# Patient Record
Sex: Female | Born: 1940
Health system: Southern US, Community
[De-identification: ages and names within clinical notes are randomized; demographics above are authoritative.]

## PROBLEM LIST (undated history)

## (undated) DIAGNOSIS — D3610 Benign neoplasm of peripheral nerves and autonomic nervous system, unspecified: Secondary | ICD-10-CM

## (undated) DIAGNOSIS — Z8619 Personal history of other infectious and parasitic diseases: Secondary | ICD-10-CM

## (undated) DIAGNOSIS — H9191 Unspecified hearing loss, right ear: Secondary | ICD-10-CM

## (undated) DIAGNOSIS — I7300 Raynaud's syndrome without gangrene: Secondary | ICD-10-CM

## (undated) DIAGNOSIS — M810 Age-related osteoporosis without current pathological fracture: Secondary | ICD-10-CM

## (undated) DIAGNOSIS — D333 Benign neoplasm of cranial nerves: Secondary | ICD-10-CM

## (undated) HISTORY — PX: ABDOMINAL HYSTERECTOMY: SHX81

## (undated) HISTORY — DX: Unspecified hearing loss, right ear: H91.91

## (undated) HISTORY — DX: Benign neoplasm of cranial nerves: D33.3

## (undated) HISTORY — DX: Age-related osteoporosis without current pathological fracture: M81.0

## (undated) HISTORY — PX: TONSILLECTOMY: SUR1361

## (undated) HISTORY — DX: Raynaud's syndrome without gangrene: I73.00

## (undated) HISTORY — DX: Personal history of other infectious and parasitic diseases: Z86.19

## (undated) HISTORY — DX: Benign neoplasm of peripheral nerves and autonomic nervous system, unspecified: D36.10

## (undated) HISTORY — PX: WISDOM TOOTH EXTRACTION: SHX21

---

## 1998-03-15 ENCOUNTER — Ambulatory Visit: Admission: RE | Admit: 1998-03-15 | Discharge: 1998-03-15 | Payer: Self-pay | Admitting: Rheumatology

## 1998-12-02 ENCOUNTER — Ambulatory Visit (HOSPITAL_BASED_OUTPATIENT_CLINIC_OR_DEPARTMENT_OTHER): Admission: RE | Admit: 1998-12-02 | Discharge: 1998-12-02 | Payer: Self-pay | Admitting: Otolaryngology

## 1999-04-22 ENCOUNTER — Ambulatory Visit (HOSPITAL_COMMUNITY): Admission: RE | Admit: 1999-04-22 | Discharge: 1999-04-22 | Payer: Self-pay | Admitting: Rheumatology

## 1999-04-22 ENCOUNTER — Encounter: Payer: Self-pay | Admitting: Rheumatology

## 1999-09-08 ENCOUNTER — Other Ambulatory Visit: Admission: RE | Admit: 1999-09-08 | Discharge: 1999-09-08 | Payer: Self-pay | Admitting: Obstetrics and Gynecology

## 2000-05-03 ENCOUNTER — Ambulatory Visit (HOSPITAL_COMMUNITY): Admission: RE | Admit: 2000-05-03 | Discharge: 2000-05-03 | Payer: Self-pay | Admitting: Rheumatology

## 2000-05-03 ENCOUNTER — Encounter: Payer: Self-pay | Admitting: Rheumatology

## 2000-11-04 ENCOUNTER — Other Ambulatory Visit: Admission: RE | Admit: 2000-11-04 | Discharge: 2000-11-04 | Payer: Self-pay | Admitting: Obstetrics and Gynecology

## 2001-04-22 ENCOUNTER — Ambulatory Visit (HOSPITAL_COMMUNITY): Admission: RE | Admit: 2001-04-22 | Discharge: 2001-04-22 | Payer: Self-pay | Admitting: *Deleted

## 2001-05-09 ENCOUNTER — Ambulatory Visit (HOSPITAL_COMMUNITY): Admission: RE | Admit: 2001-05-09 | Discharge: 2001-05-09 | Payer: Self-pay | Admitting: Rheumatology

## 2001-05-09 ENCOUNTER — Encounter: Payer: Self-pay | Admitting: Rheumatology

## 2001-07-14 ENCOUNTER — Emergency Department (HOSPITAL_COMMUNITY): Admission: EM | Admit: 2001-07-14 | Discharge: 2001-07-14 | Payer: Self-pay | Admitting: Emergency Medicine

## 2001-11-07 ENCOUNTER — Other Ambulatory Visit: Admission: RE | Admit: 2001-11-07 | Discharge: 2001-11-07 | Payer: Self-pay | Admitting: Obstetrics and Gynecology

## 2001-11-14 ENCOUNTER — Encounter: Payer: Self-pay | Admitting: Rheumatology

## 2001-11-14 ENCOUNTER — Encounter: Admission: RE | Admit: 2001-11-14 | Discharge: 2001-11-14 | Payer: Self-pay | Admitting: Rheumatology

## 2001-12-02 ENCOUNTER — Other Ambulatory Visit: Admission: RE | Admit: 2001-12-02 | Discharge: 2001-12-02 | Payer: Self-pay | Admitting: Endocrinology

## 2002-05-23 ENCOUNTER — Encounter: Payer: Self-pay | Admitting: Rheumatology

## 2002-05-23 ENCOUNTER — Ambulatory Visit (HOSPITAL_COMMUNITY): Admission: RE | Admit: 2002-05-23 | Discharge: 2002-05-23 | Payer: Self-pay | Admitting: Rheumatology

## 2003-05-08 ENCOUNTER — Ambulatory Visit (HOSPITAL_COMMUNITY): Admission: RE | Admit: 2003-05-08 | Discharge: 2003-05-08 | Payer: Self-pay | Admitting: Rheumatology

## 2003-05-12 ENCOUNTER — Emergency Department (HOSPITAL_COMMUNITY): Admission: EM | Admit: 2003-05-12 | Discharge: 2003-05-12 | Payer: Self-pay | Admitting: Emergency Medicine

## 2003-05-12 ENCOUNTER — Encounter: Payer: Self-pay | Admitting: Orthopedic Surgery

## 2003-05-30 ENCOUNTER — Ambulatory Visit (HOSPITAL_COMMUNITY): Admission: RE | Admit: 2003-05-30 | Discharge: 2003-05-30 | Payer: Self-pay | Admitting: Rheumatology

## 2003-05-30 ENCOUNTER — Encounter: Payer: Self-pay | Admitting: Rheumatology

## 2004-06-03 ENCOUNTER — Ambulatory Visit (HOSPITAL_COMMUNITY): Admission: RE | Admit: 2004-06-03 | Discharge: 2004-06-03 | Payer: Self-pay | Admitting: Rheumatology

## 2004-07-30 ENCOUNTER — Ambulatory Visit (HOSPITAL_BASED_OUTPATIENT_CLINIC_OR_DEPARTMENT_OTHER): Admission: RE | Admit: 2004-07-30 | Discharge: 2004-07-30 | Payer: Self-pay | Admitting: Surgery

## 2004-07-30 ENCOUNTER — Encounter (INDEPENDENT_AMBULATORY_CARE_PROVIDER_SITE_OTHER): Payer: Self-pay | Admitting: Specialist

## 2004-07-30 ENCOUNTER — Ambulatory Visit (HOSPITAL_COMMUNITY): Admission: RE | Admit: 2004-07-30 | Discharge: 2004-07-30 | Payer: Self-pay | Admitting: Surgery

## 2005-06-04 ENCOUNTER — Ambulatory Visit (HOSPITAL_COMMUNITY): Admission: RE | Admit: 2005-06-04 | Discharge: 2005-06-04 | Payer: Self-pay | Admitting: Rheumatology

## 2006-06-10 ENCOUNTER — Ambulatory Visit (HOSPITAL_COMMUNITY): Admission: RE | Admit: 2006-06-10 | Discharge: 2006-06-10 | Payer: Self-pay | Admitting: Rheumatology

## 2007-06-13 ENCOUNTER — Ambulatory Visit (HOSPITAL_COMMUNITY): Admission: RE | Admit: 2007-06-13 | Discharge: 2007-06-13 | Payer: Self-pay | Admitting: Rheumatology

## 2008-06-14 ENCOUNTER — Ambulatory Visit (HOSPITAL_COMMUNITY): Admission: RE | Admit: 2008-06-14 | Discharge: 2008-06-14 | Payer: Self-pay | Admitting: Rheumatology

## 2008-08-01 LAB — HM COLONOSCOPY

## 2009-08-21 ENCOUNTER — Ambulatory Visit (HOSPITAL_COMMUNITY): Admission: RE | Admit: 2009-08-21 | Discharge: 2009-08-21 | Payer: Self-pay | Admitting: Internal Medicine

## 2010-08-22 ENCOUNTER — Ambulatory Visit (HOSPITAL_COMMUNITY)
Admission: RE | Admit: 2010-08-22 | Discharge: 2010-08-22 | Payer: Self-pay | Source: Home / Self Care | Attending: Obstetrics and Gynecology | Admitting: Obstetrics and Gynecology

## 2011-01-09 NOTE — Op Note (Signed)
NAMETIARA, MAULTSBY                  ACCOUNT NO.:  [id number]   MEDICAL RECORD NO.:  [id number]          PATIENT TYPE:  AMB   LOCATION:  [facility]                         FACILITY:  [facility]   PHYSICIAN:  Currie Paris, M.D.DATE OF BIRTH:  1940/09/16   DATE OF PROCEDURE:  DATE OF DISCHARGE:                                 OPERATIVE REPORT   CCS#:  [id number].   CLINICAL COURSE:  This patient has presented with a gradually enlarging mass  in the upper left arm that has been a little sore and tender when she  touches the top of it.   DESCRIPTION OF PROCEDURE:  The patient was seen in the holding area and had  no further questions.  The area in question which was clinically thought to  be a lipoma was identified by the patient and marked by me.   She was taken to the operating room and given IV sedation. The upper arm was  prepped and draped as a sterile field. A combination of 1% Xylocaine with  epinephrine and 0.5% Marcaine were mixed equally and used for local.  The  area of the skin incision and all the overlying skin over the lipoma were  infiltrated.   I made a strict vertical incision over the mass. I  encountered the lipoma  almost directly in the subcu and using a combination of cautery scissors and  blunt dissection was able to free this up.  It was multilobulated with  little pieces of it interspersed between normal subcu tissue.  I got the  bulk of it out as a single large specimen measuring 5 cm. There appeared to  be other fragments which were then individually dissected out so that when I  was done, we had what all looked like just normal subcutaneous tissue  without any lipomatous appearing fat.  In some areas, this was right off of  the skin.   Once everything was dry, I tried to tack the subcu down to the muscle a bit  to try to reduce the chance for seroma.  The skin was then closed with 4-0  Monocryl subcuticular and Dermabond. The patient tolerated the procedure  well.  There were no operative complications. All counts were correct.     Chri   CJS/MEDQ  D:  07/30/2004  T:  07/30/2004  Job:  [id number]   cc:   Demetria Pore. Coral Spikes, M.D.  [address]  [address]  [place]  [place] [address]  Fax: [phone]

## 2011-07-27 ENCOUNTER — Other Ambulatory Visit (HOSPITAL_COMMUNITY): Payer: Self-pay | Admitting: Internal Medicine

## 2011-07-27 DIAGNOSIS — Z1231 Encounter for screening mammogram for malignant neoplasm of breast: Secondary | ICD-10-CM

## 2011-08-24 ENCOUNTER — Ambulatory Visit (HOSPITAL_COMMUNITY)
Admission: RE | Admit: 2011-08-24 | Discharge: 2011-08-24 | Disposition: A | Discharge status: Home / Self Care | Payer: Medicare Other | Source: Ambulatory Visit | Attending: Internal Medicine | Admitting: Internal Medicine

## 2011-08-24 DIAGNOSIS — Z1231 Encounter for screening mammogram for malignant neoplasm of breast: Secondary | ICD-10-CM

## 2012-07-28 ENCOUNTER — Other Ambulatory Visit (HOSPITAL_COMMUNITY): Payer: Self-pay | Admitting: Internal Medicine

## 2012-07-28 DIAGNOSIS — Z1231 Encounter for screening mammogram for malignant neoplasm of breast: Secondary | ICD-10-CM

## 2012-08-25 ENCOUNTER — Ambulatory Visit (HOSPITAL_COMMUNITY): Payer: Medicare Other

## 2012-08-29 ENCOUNTER — Ambulatory Visit (HOSPITAL_COMMUNITY): Payer: Medicare Other

## 2012-09-05 ENCOUNTER — Ambulatory Visit (HOSPITAL_COMMUNITY)
Admission: RE | Admit: 2012-09-05 | Discharge: 2012-09-05 | Disposition: A | Discharge status: Home / Self Care | Payer: Medicare Other | Source: Ambulatory Visit | Attending: Internal Medicine | Admitting: Internal Medicine

## 2012-09-05 DIAGNOSIS — Z1231 Encounter for screening mammogram for malignant neoplasm of breast: Secondary | ICD-10-CM

## 2013-10-30 ENCOUNTER — Other Ambulatory Visit (HOSPITAL_COMMUNITY): Payer: Self-pay | Admitting: Internal Medicine

## 2013-10-30 DIAGNOSIS — Z1231 Encounter for screening mammogram for malignant neoplasm of breast: Secondary | ICD-10-CM

## 2013-11-13 ENCOUNTER — Ambulatory Visit (HOSPITAL_COMMUNITY): Payer: Medicare Other

## 2013-11-14 ENCOUNTER — Ambulatory Visit (HOSPITAL_COMMUNITY)
Admission: RE | Admit: 2013-11-14 | Discharge: 2013-11-14 | Disposition: A | Discharge status: Home / Self Care | Payer: Medicare Other | Source: Ambulatory Visit | Attending: Internal Medicine | Admitting: Internal Medicine

## 2013-11-14 ENCOUNTER — Other Ambulatory Visit (HOSPITAL_COMMUNITY): Payer: Self-pay | Admitting: Internal Medicine

## 2013-11-14 DIAGNOSIS — Z1231 Encounter for screening mammogram for malignant neoplasm of breast: Secondary | ICD-10-CM

## 2016-01-28 DIAGNOSIS — Z01419 Encounter for gynecological examination (general) (routine) without abnormal findings: Secondary | ICD-10-CM | POA: Diagnosis not present

## 2016-01-28 DIAGNOSIS — Z1231 Encounter for screening mammogram for malignant neoplasm of breast: Secondary | ICD-10-CM | POA: Diagnosis not present

## 2016-01-28 DIAGNOSIS — Z1389 Encounter for screening for other disorder: Secondary | ICD-10-CM | POA: Diagnosis not present

## 2016-01-28 DIAGNOSIS — Z130 Encounter for screening for diseases of the blood and blood-forming organs and certain disorders involving the immune mechanism: Secondary | ICD-10-CM | POA: Diagnosis not present

## 2016-03-10 ENCOUNTER — Ambulatory Visit: Payer: Self-pay | Admitting: Family Medicine

## 2016-04-13 ENCOUNTER — Telehealth: Payer: Self-pay | Admitting: Behavioral Health

## 2016-04-13 NOTE — Telephone Encounter (Signed)
Per the spouse, patient is asleep at this time. Kaitlyn Hanna voiced that currently his wife is not taking any medications and does not have any known allergies. Patient is aware of tomorrow's new patient appointment with Dr. Charlett Blake at 3:00 PM.

## 2016-04-14 ENCOUNTER — Ambulatory Visit (INDEPENDENT_AMBULATORY_CARE_PROVIDER_SITE_OTHER): Payer: Medicare Other | Admitting: Family Medicine

## 2016-04-14 ENCOUNTER — Encounter: Payer: Self-pay | Admitting: Family Medicine

## 2016-04-14 VITALS — BP 108/62 | HR 88 | Temp 98.2°F | Resp 16 | Ht 60.0 in | Wt 154.6 lb

## 2016-04-14 DIAGNOSIS — Z23 Encounter for immunization: Secondary | ICD-10-CM

## 2016-04-14 DIAGNOSIS — H9191 Unspecified hearing loss, right ear: Secondary | ICD-10-CM

## 2016-04-14 DIAGNOSIS — E782 Mixed hyperlipidemia: Secondary | ICD-10-CM | POA: Diagnosis not present

## 2016-04-14 DIAGNOSIS — M8580 Other specified disorders of bone density and structure, unspecified site: Secondary | ICD-10-CM

## 2016-04-14 DIAGNOSIS — M810 Age-related osteoporosis without current pathological fracture: Secondary | ICD-10-CM | POA: Insufficient documentation

## 2016-04-14 DIAGNOSIS — I7300 Raynaud's syndrome without gangrene: Secondary | ICD-10-CM

## 2016-04-14 HISTORY — DX: Unspecified hearing loss, right ear: H91.91

## 2016-04-14 HISTORY — DX: Age-related osteoporosis without current pathological fracture: M81.0

## 2016-04-14 HISTORY — DX: Raynaud's syndrome without gangrene: I73.00

## 2016-04-14 MED ORDER — ZOSTER VACCINE LIVE 19400 UNT/0.65ML ~~LOC~~ SUSR: 0.6500 mL | Freq: Once | SUBCUTANEOUS | 0 refills | Status: AC

## 2016-04-14 NOTE — Progress Notes (Signed)
Pre visit review using our clinic review tool, if applicable. No additional management support is needed unless otherwise documented below in the visit note. 

## 2016-04-14 NOTE — Patient Instructions (Signed)
Preventive Care for Adults, Female A healthy lifestyle and preventive care can promote health and wellness. Preventive health guidelines for women include the following key practices.  A routine yearly physical is a good way to check with your health care provider about your health and preventive screening. It is a chance to share any concerns and updates on your health and to receive a thorough exam.  Visit your dentist for a routine exam and preventive care every 6 months. Brush your teeth twice a day and floss once a day. Good oral hygiene prevents tooth decay and gum disease.  The frequency of eye exams is based on your age, health, family medical history, use of contact lenses, and other factors. Follow your health care provider's recommendations for frequency of eye exams.  Eat a healthy diet. Foods like vegetables, fruits, whole grains, low-fat dairy products, and lean protein foods contain the nutrients you need without too many calories. Decrease your intake of foods high in solid fats, added sugars, and salt. Eat the right amount of calories for you.Get information about a proper diet from your health care provider, if necessary.  Regular physical exercise is one of the most important things you can do for your health. Most adults should get at least 150 minutes of moderate-intensity exercise (any activity that increases your heart rate and causes you to sweat) each week. In addition, most adults need muscle-strengthening exercises on 2 or more days a week.  Maintain a healthy weight. The body mass index (BMI) is a screening tool to identify possible weight problems. It provides an estimate of body fat based on height and weight. Your health care provider can find your BMI and can help you achieve or maintain a healthy weight.For adults 20 years and older:  A BMI below 18.5 is considered underweight.  A BMI of 18.5 to 24.9 is normal.  A BMI of 25 to 29.9 is considered overweight.  A  BMI of 30 and above is considered obese.  Maintain normal blood lipids and cholesterol levels by exercising and minimizing your intake of saturated fat. Eat a balanced diet with plenty of fruit and vegetables. Blood tests for lipids and cholesterol should begin at age 45 and be repeated every 5 years. If your lipid or cholesterol levels are high, you are over 50, or you are at high risk for heart disease, you may need your cholesterol levels checked more frequently.Ongoing high lipid and cholesterol levels should be treated with medicines if diet and exercise are not working.  If you smoke, find out from your health care provider how to quit. If you do not use tobacco, do not start.  Lung cancer screening is recommended for adults aged 45-80 years who are at high risk for developing lung cancer because of a history of smoking. A yearly low-dose CT scan of the lungs is recommended for people who have at least a 30-pack-year history of smoking and are a current smoker or have quit within the past 15 years. A pack year of smoking is smoking an average of 1 pack of cigarettes a day for 1 year (for example: 1 pack a day for 30 years or 2 packs a day for 15 years). Yearly screening should continue until the smoker has stopped smoking for at least 15 years. Yearly screening should be stopped for people who develop a health problem that would prevent them from having lung cancer treatment.  If you are pregnant, do not drink alcohol. If you are  breastfeeding, be very cautious about drinking alcohol. If you are not pregnant and choose to drink alcohol, do not have more than 1 drink per day. One drink is considered to be 12 ounces (355 mL) of beer, 5 ounces (148 mL) of wine, or 1.5 ounces (44 mL) of liquor.  Avoid use of street drugs. Do not share needles with anyone. Ask for help if you need support or instructions about stopping the use of drugs.  High blood pressure causes heart disease and increases the risk  of stroke. Your blood pressure should be checked at least every 1 to 2 years. Ongoing high blood pressure should be treated with medicines if weight loss and exercise do not work.  If you are 55-79 years old, ask your health care provider if you should take aspirin to prevent strokes.  Diabetes screening is done by taking a blood sample to check your blood glucose level after you have not eaten for a certain period of time (fasting). If you are not overweight and you do not have risk factors for diabetes, you should be screened once every 3 years starting at age 45. If you are overweight or obese and you are 40-70 years of age, you should be screened for diabetes every year as part of your cardiovascular risk assessment.  Breast cancer screening is essential preventive care for women. You should practice "breast self-awareness." This means understanding the normal appearance and feel of your breasts and may include breast self-examination. Any changes detected, no matter how small, should be reported to a health care provider. Women in their 20s and 30s should have a clinical breast exam (CBE) by a health care provider as part of a regular health exam every 1 to 3 years. After age 40, women should have a CBE every year. Starting at age 40, women should consider having a mammogram (breast X-ray test) every year. Women who have a family history of breast cancer should talk to their health care provider about genetic screening. Women at a high risk of breast cancer should talk to their health care providers about having an MRI and a mammogram every year.  Breast cancer gene (BRCA)-related cancer risk assessment is recommended for women who have family members with BRCA-related cancers. BRCA-related cancers include breast, ovarian, tubal, and peritoneal cancers. Having family members with these cancers may be associated with an increased risk for harmful changes (mutations) in the breast cancer genes BRCA1 and  BRCA2. Results of the assessment will determine the need for genetic counseling and BRCA1 and BRCA2 testing.  Your health care provider may recommend that you be screened regularly for cancer of the pelvic organs (ovaries, uterus, and vagina). This screening involves a pelvic examination, including checking for microscopic changes to the surface of your cervix (Pap test). You may be encouraged to have this screening done every 3 years, beginning at age 21.  For women ages 30-65, health care providers may recommend pelvic exams and Pap testing every 3 years, or they may recommend the Pap and pelvic exam, combined with testing for human papilloma virus (HPV), every 5 years. Some types of HPV increase your risk of cervical cancer. Testing for HPV may also be done on women of any age with unclear Pap test results.  Other health care providers may not recommend any screening for nonpregnant women who are considered low risk for pelvic cancer and who do not have symptoms. Ask your health care provider if a screening pelvic exam is right for   you.  If you have had past treatment for cervical cancer or a condition that could lead to cancer, you need Pap tests and screening for cancer for at least 20 years after your treatment. If Pap tests have been discontinued, your risk factors (such as having a new sexual partner) need to be reassessed to determine if screening should resume. Some women have medical problems that increase the chance of getting cervical cancer. In these cases, your health care provider may recommend more frequent screening and Pap tests.  Colorectal cancer can be detected and often prevented. Most routine colorectal cancer screening begins at the age of 50 years and continues through age 75 years. However, your health care provider may recommend screening at an earlier age if you have risk factors for colon cancer. On a yearly basis, your health care provider may provide home test kits to check  for hidden blood in the stool. Use of a small camera at the end of a tube, to directly examine the colon (sigmoidoscopy or colonoscopy), can detect the earliest forms of colorectal cancer. Talk to your health care provider about this at age 50, when routine screening begins. Direct exam of the colon should be repeated every 5-10 years through age 75 years, unless early forms of precancerous polyps or small growths are found.  People who are at an increased risk for hepatitis B should be screened for this virus. You are considered at high risk for hepatitis B if:  You were born in a country where hepatitis B occurs often. Talk with your health care provider about which countries are considered high risk.  Your parents were born in a high-risk country and you have not received a shot to protect against hepatitis B (hepatitis B vaccine).  You have HIV or AIDS.  You use needles to inject street drugs.  You live with, or have sex with, someone who has hepatitis B.  You get hemodialysis treatment.  You take certain medicines for conditions like cancer, organ transplantation, and autoimmune conditions.  Hepatitis C blood testing is recommended for all people born from 1945 through 1965 and any individual with known risks for hepatitis C.  Practice safe sex. Use condoms and avoid high-risk sexual practices to reduce the spread of sexually transmitted infections (STIs). STIs include gonorrhea, chlamydia, syphilis, trichomonas, herpes, HPV, and human immunodeficiency virus (HIV). Herpes, HIV, and HPV are viral illnesses that have no cure. They can result in disability, cancer, and death.  You should be screened for sexually transmitted illnesses (STIs) including gonorrhea and chlamydia if:  You are sexually active and are younger than 24 years.  You are older than 24 years and your health care provider tells you that you are at risk for this type of infection.  Your sexual activity has changed  since you were last screened and you are at an increased risk for chlamydia or gonorrhea. Ask your health care provider if you are at risk.  If you are at risk of being infected with HIV, it is recommended that you take a prescription medicine daily to prevent HIV infection. This is called preexposure prophylaxis (PrEP). You are considered at risk if:  You are sexually active and do not regularly use condoms or know the HIV status of your partner(s).  You take drugs by injection.  You are sexually active with a partner who has HIV.  Talk with your health care provider about whether you are at high risk of being infected with HIV. If   you choose to begin PrEP, you should first be tested for HIV. You should then be tested every 3 months for as long as you are taking PrEP.  Osteoporosis is a disease in which the bones lose minerals and strength with aging. This can result in serious bone fractures or breaks. The risk of osteoporosis can be identified using a bone density scan. Women ages 67 years and over and women at risk for fractures or osteoporosis should discuss screening with their health care providers. Ask your health care provider whether you should take a calcium supplement or vitamin D to reduce the rate of osteoporosis.  Menopause can be associated with physical symptoms and risks. Hormone replacement therapy is available to decrease symptoms and risks. You should talk to your health care provider about whether hormone replacement therapy is right for you.  Use sunscreen. Apply sunscreen liberally and repeatedly throughout the day. You should seek shade when your shadow is shorter than you. Protect yourself by wearing long sleeves, pants, a wide-brimmed hat, and sunglasses year round, whenever you are outdoors.  Once a month, do a whole body skin exam, using a mirror to look at the skin on your back. Tell your health care provider of new moles, moles that have irregular borders, moles that  are larger than a pencil eraser, or moles that have changed in shape or color.  Stay current with required vaccines (immunizations).  Influenza vaccine. All adults should be immunized every year.  Tetanus, diphtheria, and acellular pertussis (Td, Tdap) vaccine. Pregnant women should receive 1 dose of Tdap vaccine during each pregnancy. The dose should be obtained regardless of the length of time since the last dose. Immunization is preferred during the 27th-36th week of gestation. An adult who has not previously received Tdap or who does not know her vaccine status should receive 1 dose of Tdap. This initial dose should be followed by tetanus and diphtheria toxoids (Td) booster doses every 10 years. Adults with an unknown or incomplete history of completing a 3-dose immunization series with Td-containing vaccines should begin or complete a primary immunization series including a Tdap dose. Adults should receive a Td booster every 10 years.  Varicella vaccine. An adult without evidence of immunity to varicella should receive 2 doses or a second dose if she has previously received 1 dose. Pregnant females who do not have evidence of immunity should receive the first dose after pregnancy. This first dose should be obtained before leaving the health care facility. The second dose should be obtained 4-8 weeks after the first dose.  Human papillomavirus (HPV) vaccine. Females aged 13-26 years who have not received the vaccine previously should obtain the 3-dose series. The vaccine is not recommended for use in pregnant females. However, pregnancy testing is not needed before receiving a dose. If a female is found to be pregnant after receiving a dose, no treatment is needed. In that case, the remaining doses should be delayed until after the pregnancy. Immunization is recommended for any person with an immunocompromised condition through the age of 61 years if she did not get any or all doses earlier. During the  3-dose series, the second dose should be obtained 4-8 weeks after the first dose. The third dose should be obtained 24 weeks after the first dose and 16 weeks after the second dose.  Zoster vaccine. One dose is recommended for adults aged 30 years or older unless certain conditions are present.  Measles, mumps, and rubella (MMR) vaccine. Adults born  before 1957 generally are considered immune to measles and mumps. Adults born in 1957 or later should have 1 or more doses of MMR vaccine unless there is a contraindication to the vaccine or there is laboratory evidence of immunity to each of the three diseases. A routine second dose of MMR vaccine should be obtained at least 28 days after the first dose for students attending postsecondary schools, health care workers, or international travelers. People who received inactivated measles vaccine or an unknown type of measles vaccine during 1963-1967 should receive 2 doses of MMR vaccine. People who received inactivated mumps vaccine or an unknown type of mumps vaccine before 1979 and are at high risk for mumps infection should consider immunization with 2 doses of MMR vaccine. For females of childbearing age, rubella immunity should be determined. If there is no evidence of immunity, females who are not pregnant should be vaccinated. If there is no evidence of immunity, females who are pregnant should delay immunization until after pregnancy. Unvaccinated health care workers born before 1957 who lack laboratory evidence of measles, mumps, or rubella immunity or laboratory confirmation of disease should consider measles and mumps immunization with 2 doses of MMR vaccine or rubella immunization with 1 dose of MMR vaccine.  Pneumococcal 13-valent conjugate (PCV13) vaccine. When indicated, a person who is uncertain of his immunization history and has no record of immunization should receive the PCV13 vaccine. All adults 65 years of age and older should receive this  vaccine. An adult aged 19 years or older who has certain medical conditions and has not been previously immunized should receive 1 dose of PCV13 vaccine. This PCV13 should be followed with a dose of pneumococcal polysaccharide (PPSV23) vaccine. Adults who are at high risk for pneumococcal disease should obtain the PPSV23 vaccine at least 8 weeks after the dose of PCV13 vaccine. Adults older than 73 years of age who have normal immune system function should obtain the PPSV23 vaccine dose at least 1 year after the dose of PCV13 vaccine.  Pneumococcal polysaccharide (PPSV23) vaccine. When PCV13 is also indicated, PCV13 should be obtained first. All adults aged 65 years and older should be immunized. An adult younger than age 65 years who has certain medical conditions should be immunized. Any person who resides in a nursing home or long-term care facility should be immunized. An adult smoker should be immunized. People with an immunocompromised condition and certain other conditions should receive both PCV13 and PPSV23 vaccines. People with human immunodeficiency virus (HIV) infection should be immunized as soon as possible after diagnosis. Immunization during chemotherapy or radiation therapy should be avoided. Routine use of PPSV23 vaccine is not recommended for American Indians, [place] Natives, or people younger than 65 years unless there are medical conditions that require PPSV23 vaccine. When indicated, people who have unknown immunization and have no record of immunization should receive PPSV23 vaccine. One-time revaccination 5 years after the first dose of PPSV23 is recommended for people aged 19-64 years who have chronic kidney failure, nephrotic syndrome, asplenia, or immunocompromised conditions. People who received 1-2 doses of PPSV23 before age 65 years should receive another dose of PPSV23 vaccine at age 65 years or later if at least 5 years have passed since the previous dose. Doses of PPSV23 are not  needed for people immunized with PPSV23 at or after age 65 years.  Meningococcal vaccine. Adults with asplenia or persistent complement component deficiencies should receive 2 doses of quadrivalent meningococcal conjugate (MenACWY-D) vaccine. The doses should be obtained   at least 2 months apart. Microbiologists working with certain meningococcal bacteria, [place] recruits, people at risk during an outbreak, and people who travel to or live in countries with a high rate of meningitis should be immunized. A first-year college student up through age 34 years who is living in a residence hall should receive a dose if she did not receive a dose on or after her 16th birthday. Adults who have certain high-risk conditions should receive one or more doses of vaccine.  Hepatitis A vaccine. Adults who wish to be protected from this disease, have certain high-risk conditions, work with hepatitis A-infected animals, work in hepatitis A research labs, or travel to or work in countries with a high rate of hepatitis A should be immunized. Adults who were previously unvaccinated and who anticipate close contact with an international adoptee during the first 60 days after arrival in the [place] from a country with a high rate of hepatitis A should be immunized.  Hepatitis B vaccine. Adults who wish to be protected from this disease, have certain high-risk conditions, may be exposed to blood or other infectious body fluids, are household contacts or sex partners of hepatitis B positive people, are clients or workers in certain care facilities, or travel to or work in countries with a high rate of hepatitis B should be immunized.  Haemophilus influenzae type b (Hib) vaccine. A previously unvaccinated person with asplenia or sickle cell disease or having a scheduled splenectomy should receive 1 dose of Hib vaccine. Regardless of previous immunization, a recipient of a hematopoietic stem cell transplant should receive a  3-dose series 6-12 months after her successful transplant. Hib vaccine is not recommended for adults with HIV infection. Preventive Services / Frequency Ages 35 to 4 years  Blood pressure check.** / Every 3-5 years.  Lipid and cholesterol check.** / Every 5 years beginning at age 60.  Clinical breast exam.** / Every 3 years for women in their 71s and 10s.  BRCA-related cancer risk assessment.** / For women who have family members with a BRCA-related cancer (breast, ovarian, tubal, or peritoneal cancers).  Pap test.** / Every 2 years from ages 76 through 26. Every 3 years starting at age 61 through age 76 or 93 with a history of 3 consecutive normal Pap tests.  HPV screening.** / Every 3 years from ages 37 through ages 60 to 51 with a history of 3 consecutive normal Pap tests.  Hepatitis C blood test.** / For any individual with known risks for hepatitis C.  Skin self-exam. / Monthly.  Influenza vaccine. / Every year.  Tetanus, diphtheria, and acellular pertussis (Tdap, Td) vaccine.** / Consult your health care provider. Pregnant women should receive 1 dose of Tdap vaccine during each pregnancy. 1 dose of Td every 10 years.  Varicella vaccine.** / Consult your health care provider. Pregnant females who do not have evidence of immunity should receive the first dose after pregnancy.  HPV vaccine. / 3 doses over 6 months, if 93 and younger. The vaccine is not recommended for use in pregnant females. However, pregnancy testing is not needed before receiving a dose.  Measles, mumps, rubella (MMR) vaccine.** / You need at least 1 dose of MMR if you were born in 1957 or later. You may also need a 2nd dose. For females of childbearing age, rubella immunity should be determined. If there is no evidence of immunity, females who are not pregnant should be vaccinated. If there is no evidence of immunity, females who are  pregnant should delay immunization until after pregnancy.  Pneumococcal  13-valent conjugate (PCV13) vaccine.** / Consult your health care provider.  Pneumococcal polysaccharide (PPSV23) vaccine.** / 1 to 2 doses if you smoke cigarettes or if you have certain conditions.  Meningococcal vaccine.** / 1 dose if you are age 68 to 8 years and a Market researcher living in a residence hall, or have one of several medical conditions, you need to get vaccinated against meningococcal disease. You may also need additional booster doses.  Hepatitis A vaccine.** / Consult your health care provider.  Hepatitis B vaccine.** / Consult your health care provider.  Haemophilus influenzae type b (Hib) vaccine.** / Consult your health care provider. Ages 7 to 53 years  Blood pressure check.** / Every year.  Lipid and cholesterol check.** / Every 5 years beginning at age 25 years.  Lung cancer screening. / Every year if you are aged 11-80 years and have a 30-pack-year history of smoking and currently smoke or have quit within the past 15 years. Yearly screening is stopped once you have quit smoking for at least 15 years or develop a health problem that would prevent you from having lung cancer treatment.  Clinical breast exam.** / Every year after age 48 years.  BRCA-related cancer risk assessment.** / For women who have family members with a BRCA-related cancer (breast, ovarian, tubal, or peritoneal cancers).  Mammogram.** / Every year beginning at age 41 years and continuing for as long as you are in good health. Consult with your health care provider.  Pap test.** / Every 3 years starting at age 65 years through age 37 or 70 years with a history of 3 consecutive normal Pap tests.  HPV screening.** / Every 3 years from ages 72 years through ages 60 to 40 years with a history of 3 consecutive normal Pap tests.  Fecal occult blood test (FOBT) of stool. / Every year beginning at age 21 years and continuing until age 5 years. You may not need to do this test if you get  a colonoscopy every 10 years.  Flexible sigmoidoscopy or colonoscopy.** / Every 5 years for a flexible sigmoidoscopy or every 10 years for a colonoscopy beginning at age 35 years and continuing until age 48 years.  Hepatitis C blood test.** / For all people born from [birth year] through 1965 and any individual with known risks for hepatitis C.  Skin self-exam. / Monthly.  Influenza vaccine. / Every year.  Tetanus, diphtheria, and acellular pertussis (Tdap/Td) vaccine.** / Consult your health care provider. Pregnant women should receive 1 dose of Tdap vaccine during each pregnancy. 1 dose of Td every 10 years.  Varicella vaccine.** / Consult your health care provider. Pregnant females who do not have evidence of immunity should receive the first dose after pregnancy.  Zoster vaccine.** / 1 dose for adults aged 30 years or older.  Measles, mumps, rubella (MMR) vaccine.** / You need at least 1 dose of MMR if you were born in 1957 or later. You may also need a second dose. For females of childbearing age, rubella immunity should be determined. If there is no evidence of immunity, females who are not pregnant should be vaccinated. If there is no evidence of immunity, females who are pregnant should delay immunization until after pregnancy.  Pneumococcal 13-valent conjugate (PCV13) vaccine.** / Consult your health care provider.  Pneumococcal polysaccharide (PPSV23) vaccine.** / 1 to 2 doses if you smoke cigarettes or if you have certain conditions.  Meningococcal vaccine.** /  Consult your health care provider.  Hepatitis A vaccine.** / Consult your health care provider.  Hepatitis B vaccine.** / Consult your health care provider.  Haemophilus influenzae type b (Hib) vaccine.** / Consult your health care provider. Ages 64 years and over  Blood pressure check.** / Every year.  Lipid and cholesterol check.** / Every 5 years beginning at age 23 years.  Lung cancer screening. / Every year if you  are aged 16-80 years and have a 30-pack-year history of smoking and currently smoke or have quit within the past 15 years. Yearly screening is stopped once you have quit smoking for at least 15 years or develop a health problem that would prevent you from having lung cancer treatment.  Clinical breast exam.** / Every year after age 74 years.  BRCA-related cancer risk assessment.** / For women who have family members with a BRCA-related cancer (breast, ovarian, tubal, or peritoneal cancers).  Mammogram.** / Every year beginning at age 44 years and continuing for as long as you are in good health. Consult with your health care provider.  Pap test.** / Every 3 years starting at age 58 years through age 22 or 39 years with 3 consecutive normal Pap tests. Testing can be stopped between 65 and 70 years with 3 consecutive normal Pap tests and no abnormal Pap or HPV tests in the past 10 years.  HPV screening.** / Every 3 years from ages 64 years through ages 70 or 61 years with a history of 3 consecutive normal Pap tests. Testing can be stopped between 65 and 70 years with 3 consecutive normal Pap tests and no abnormal Pap or HPV tests in the past 10 years.  Fecal occult blood test (FOBT) of stool. / Every year beginning at age 40 years and continuing until age 27 years. You may not need to do this test if you get a colonoscopy every 10 years.  Flexible sigmoidoscopy or colonoscopy.** / Every 5 years for a flexible sigmoidoscopy or every 10 years for a colonoscopy beginning at age 7 years and continuing until age 32 years.  Hepatitis C blood test.** / For all people born from [birth year] through 1965 and any individual with known risks for hepatitis C.  Osteoporosis screening.** / A one-time screening for women ages 30 years and over and women at risk for fractures or osteoporosis.  Skin self-exam. / Monthly.  Influenza vaccine. / Every year.  Tetanus, diphtheria, and acellular pertussis (Tdap/Td)  vaccine.** / 1 dose of Td every 10 years.  Varicella vaccine.** / Consult your health care provider.  Zoster vaccine.** / 1 dose for adults aged 35 years or older.  Pneumococcal 13-valent conjugate (PCV13) vaccine.** / Consult your health care provider.  Pneumococcal polysaccharide (PPSV23) vaccine.** / 1 dose for all adults aged 46 years and older.  Meningococcal vaccine.** / Consult your health care provider.  Hepatitis A vaccine.** / Consult your health care provider.  Hepatitis B vaccine.** / Consult your health care provider.  Haemophilus influenzae type b (Hib) vaccine.** / Consult your health care provider. ** Family history and personal history of risk and conditions may change your health care provider's recommendations.   This information is not intended to replace advice given to you by your health care provider. Make sure you discuss any questions you have with your health care provider.   Document Released: 10/06/2001 Document Revised: 08/31/2014 Document Reviewed: 01/05/2011 Elsevier Interactive Patient Education [employer].

## 2016-04-14 NOTE — Assessment & Plan Note (Signed)
Encouraged to get adequate exercise, calcium and vitamin d intake 

## 2016-04-27 NOTE — Assessment & Plan Note (Signed)
Encouraged heart healthy diet, increase exercise, avoid trans fats, consider a krill oil cap daily 

## 2016-04-27 NOTE — Assessment & Plan Note (Signed)
Encouraged to stay warm and use wool socks report worsening symptoms.

## 2016-04-27 NOTE — Progress Notes (Signed)
Patient ID: Kaitlyn Hanna, female   DOB: Jun 13, 1941, 75 y.o.   MRN: [id number]   Subjective:    Patient ID: Kaitlyn Hanna, female    DOB: June 20, 1941, 75 y.o.   MRN: [id number]  Chief Complaint  Patient presents with  . New Patient (Initial Visit)    HPI Patient is in today for new patient appointment. She is feeling well today. She has a past medical history of osteopenia, hyperlipidemia and Raynaud's Phenomenon. No recent illness or acute concerns. Denies CP/palp/SOB/HA/congestion/fevers/GI or GU c/o. Taking meds as prescribed  Past Medical History:  Diagnosis Date  . H/O measles   . Hearing loss in right ear 04/14/2016  . Raynaud's phenomenon 04/14/2016    Past Surgical History:  Procedure Laterality Date  . ABDOMINAL HYSTERECTOMY     age 65 TAH b/l SPO for endometriosis  . TONSILLECTOMY      Family History  Problem Relation Age of Onset  . Heart disease Mother     MI  . Cancer Father     lung cancer, small cell, smoker  . Hypertension Sister   . Dementia Sister   . Alcohol abuse Brother   . Cancer Maternal Grandmother   . Dementia Sister   . Arthritis Sister   . Cancer Brother     Social History   Social History  . Marital status: Married    Spouse name: N/A  . Number of children: N/A  . Years of education: N/A   Occupational History  . Not on file.   Social History Main Topics  . Smoking status: Never Smoker  . Smokeless tobacco: Never Used  . Alcohol use No  . Drug use: No  . Sexual activity: Not on file   Other Topics Concern  . Not on file   Social History Narrative   Lives with husband   Retired from medical office work with [employer]   No major dietary restrictions   Exercises daily. Walks and Y deep water exercise   Raised 2 stepkids, son and daughter    No outpatient prescriptions prior to visit.   No facility-administered medications prior to visit.     No Known Allergies  Review of Systems  Constitutional: Negative for chills, fever and  malaise/fatigue.  HENT: Negative for congestion and hearing loss.   Eyes: Negative for discharge.  Respiratory: Negative for cough, sputum production and shortness of breath.   Cardiovascular: Negative for chest pain, palpitations and leg swelling.  Gastrointestinal: Negative for abdominal pain, blood in stool, constipation, diarrhea, heartburn, nausea and vomiting.  Genitourinary: Negative for dysuria, frequency, hematuria and urgency.  Musculoskeletal: Negative for back pain, falls and myalgias.  Skin: Negative for rash.  Neurological: Negative for dizziness, sensory change, loss of consciousness, weakness and headaches.  Endo/Heme/Allergies: Negative for environmental allergies. Does not bruise/bleed easily.  Psychiatric/Behavioral: Negative for depression and suicidal ideas. The patient is not nervous/anxious and does not have insomnia.        Objective:    Physical Exam  Constitutional: She is oriented to person, place, and time. She appears well-developed and well-nourished. No distress.  HENT:  Head: Normocephalic and atraumatic.  Eyes: Conjunctivae are normal.  Neck: Neck supple. No thyromegaly present.  Cardiovascular: Normal rate, regular rhythm and normal heart sounds.   No murmur heard. Pulmonary/Chest: Effort normal and breath sounds normal. No respiratory distress.  Abdominal: Soft. Bowel sounds are normal. She exhibits no distension and no mass. There is no tenderness.  Musculoskeletal: She exhibits no  edema.  Lymphadenopathy:    She has no cervical adenopathy.  Neurological: She is alert and oriented to person, place, and time.  Skin: Skin is warm and dry.  Psychiatric: She has a normal mood and affect. Her behavior is normal.    BP 108/62 (BP Location: Left Arm, Patient Position: Sitting, Cuff Size: Normal)   Pulse 88   Temp 98.2 F (36.8 C) (Oral)   Resp 16   Ht 5' (1.524 m)   Wt 154 lb 9.6 oz (70.1 kg)   SpO2 95%   BMI 30.19 kg/m  Wt Readings from Last  3 Encounters:  04/14/16 154 lb 9.6 oz (70.1 kg)     No results found for: WBC, HGB, HCT, PLT, GLUCOSE, CHOL, TRIG, HDL, LDLDIRECT, LDLCALC, ALT, AST, NA, K, CL, CREATININE, BUN, CO2, TSH, PSA, INR, GLUF, HGBA1C, MICROALBUR  No results found for: TSH No results found for: WBC, HGB, HCT, MCV, PLT No results found for: NA, K, CHLORIDE, CO2, GLUCOSE, BUN, CREATININE, BILITOT, ALKPHOS, AST, ALT, PROT, ALBUMIN, CALCIUM, ANIONGAP, EGFR, GFR No results found for: CHOL No results found for: HDL No results found for: LDLCALC No results found for: TRIG No results found for: CHOLHDL No results found for: HGBA1C     Assessment & Plan:   Problem List Items Addressed This Visit    Hearing loss in right ear   Raynaud's phenomenon    Encouraged to stay warm and use wool socks report worsening symptoms.       Hyperlipidemia, mixed    Encouraged heart healthy diet, increase exercise, avoid trans fats, consider a krill oil cap daily      Osteopenia    Encouraged to get adequate exercise, calcium and vitamin d intake       Other Visit Diagnoses    Need for viral immunization    -  Primary      I am having Kaitlyn Hanna start on Zoster Vaccine Live (PF). I am also having her maintain her multivitamin with minerals and Specialty Vitamins Products (ONE-A-DAY BONE STRENGTH PO).  Meds ordered this encounter  Medications  . Multiple Vitamins-Minerals (MULTIVITAMIN WITH MINERALS) tablet    Sig: Take 1 tablet by mouth daily.  Marland Kitchen Specialty Vitamins Products (ONE-A-DAY BONE STRENGTH PO)    Sig: Take by mouth 3 (three) times daily.  Marland Kitchen Zoster Vaccine Live, PF, (ZOSTAVAX) 54270 UNT/0.65ML injection    Sig: Inject 19,400 Units into the skin once.    Dispense:  1 each    Refill:  0     Penni Homans, MD

## 2016-05-20 DIAGNOSIS — H52223 Regular astigmatism, bilateral: Secondary | ICD-10-CM | POA: Diagnosis not present

## 2016-05-20 DIAGNOSIS — H524 Presbyopia: Secondary | ICD-10-CM | POA: Diagnosis not present

## 2016-05-20 DIAGNOSIS — H5203 Hypermetropia, bilateral: Secondary | ICD-10-CM | POA: Diagnosis not present

## 2016-05-20 DIAGNOSIS — H04123 Dry eye syndrome of bilateral lacrimal glands: Secondary | ICD-10-CM | POA: Diagnosis not present

## 2016-06-09 DIAGNOSIS — H90A21 Sensorineural hearing loss, unilateral, right ear, with restricted hearing on the contralateral side: Secondary | ICD-10-CM | POA: Diagnosis not present

## 2016-06-10 DIAGNOSIS — H6121 Impacted cerumen, right ear: Secondary | ICD-10-CM | POA: Diagnosis not present

## 2016-06-10 DIAGNOSIS — H903 Sensorineural hearing loss, bilateral: Secondary | ICD-10-CM | POA: Diagnosis not present

## 2016-06-11 ENCOUNTER — Other Ambulatory Visit (INDEPENDENT_AMBULATORY_CARE_PROVIDER_SITE_OTHER): Payer: Self-pay | Admitting: Otolaryngology

## 2016-06-11 DIAGNOSIS — H833X9 Noise effects on inner ear, unspecified ear: Secondary | ICD-10-CM

## 2016-06-21 ENCOUNTER — Ambulatory Visit
Admission: RE | Admit: 2016-06-21 | Discharge: 2016-06-21 | Disposition: A | Discharge status: Home / Self Care | Payer: Medicare Other | Source: Ambulatory Visit | Attending: Otolaryngology | Admitting: Otolaryngology

## 2016-06-21 DIAGNOSIS — H833X9 Noise effects on inner ear, unspecified ear: Secondary | ICD-10-CM

## 2016-06-21 DIAGNOSIS — H9191 Unspecified hearing loss, right ear: Secondary | ICD-10-CM | POA: Diagnosis not present

## 2016-06-21 MED ORDER — GADOBENATE DIMEGLUMINE 529 MG/ML IV SOLN
15.0000 mL | Freq: Once | INTRAVENOUS | Status: AC | PRN
  Administered 2016-06-21: 15 mL via INTRAVENOUS

## 2016-06-30 ENCOUNTER — Ambulatory Visit (INDEPENDENT_AMBULATORY_CARE_PROVIDER_SITE_OTHER): Payer: Medicare Other | Admitting: Family Medicine

## 2016-06-30 ENCOUNTER — Encounter: Payer: Self-pay | Admitting: Family Medicine

## 2016-06-30 DIAGNOSIS — E782 Mixed hyperlipidemia: Secondary | ICD-10-CM | POA: Diagnosis not present

## 2016-06-30 DIAGNOSIS — H9191 Unspecified hearing loss, right ear: Secondary | ICD-10-CM

## 2016-06-30 DIAGNOSIS — D3610 Benign neoplasm of peripheral nerves and autonomic nervous system, unspecified: Secondary | ICD-10-CM | POA: Diagnosis not present

## 2016-06-30 DIAGNOSIS — M8580 Other specified disorders of bone density and structure, unspecified site: Secondary | ICD-10-CM | POA: Diagnosis not present

## 2016-06-30 DIAGNOSIS — D333 Benign neoplasm of cranial nerves: Secondary | ICD-10-CM

## 2016-06-30 DIAGNOSIS — Z0000 Encounter for general adult medical examination without abnormal findings: Secondary | ICD-10-CM | POA: Diagnosis not present

## 2016-06-30 HISTORY — DX: Benign neoplasm of cranial nerves: D33.3

## 2016-06-30 HISTORY — DX: Benign neoplasm of peripheral nerves and autonomic nervous system, unspecified: D36.10

## 2016-06-30 LAB — COMPREHENSIVE METABOLIC PANEL
ALK PHOS: 81 U/L (ref 39–117)
ALT: 13 U/L (ref 0–35)
AST: 18 U/L (ref 0–37)
Albumin: 4 g/dL (ref 3.5–5.2)
BILIRUBIN TOTAL: 0.4 mg/dL (ref 0.2–1.2)
BUN: 18 mg/dL (ref 6–23)
CALCIUM: 9.5 mg/dL (ref 8.4–10.5)
CO2: 28 mEq/L (ref 19–32)
CREATININE: 1.04 mg/dL (ref 0.40–1.20)
Chloride: 102 mEq/L (ref 96–112)
GFR: 55.14 mL/min — AB (ref >60.00)
Glucose, Bld: 87 mg/dL (ref 70–99)
Potassium: 3.9 mEq/L (ref 3.5–5.1)
Sodium: 137 mEq/L (ref 135–145)
TOTAL PROTEIN: 7.4 g/dL (ref 6.0–8.3)

## 2016-06-30 LAB — LIPID PANEL
CHOL/HDL RATIO: 4
CHOLESTEROL: 211 mg/dL — AB (ref 0–200)
HDL: 48.4 mg/dL (ref >39.00)
LDL Cholesterol: 142 mg/dL — ABNORMAL HIGH (ref 0–99)
NonHDL: 162.68
TRIGLYCERIDES: 104 mg/dL (ref 0.0–149.0)
VLDL: 20.8 mg/dL (ref 0.0–40.0)

## 2016-06-30 LAB — CBC
HCT: 36.9 % (ref 36.0–46.0)
Hemoglobin: 12.3 g/dL (ref 12.0–15.0)
MCHC: 33.3 g/dL (ref 30.0–36.0)
MCV: 87.5 fl (ref 78.0–100.0)
PLATELETS: 256 10*3/uL (ref 150.0–400.0)
RBC: 4.21 Mil/uL (ref 3.87–5.11)
RDW: 14.1 % (ref 11.5–15.5)
WBC: 6.5 10*3/uL (ref 4.0–10.5)

## 2016-06-30 LAB — TSH: TSH: 1.15 u[IU]/mL (ref 0.35–4.50)

## 2016-06-30 NOTE — Assessment & Plan Note (Addendum)
Patient encouraged to maintain heart healthy diet, regular exercise, adequate sleep. Consider daily probiotics. Take medications as prescribed. Requested copy of ACP documents she has done

## 2016-06-30 NOTE — Assessment & Plan Note (Signed)
Went to [facility] for evaluation of right ear hearing loss. Was referred to Dr Benjamine Mola of ENT had a repeat hearing test, confirmed unilateral hearing loss and was sent for MRI

## 2016-06-30 NOTE — Assessment & Plan Note (Signed)
Right internal auditory canal, has an appt with her ENT Dr Benjamine Mola tomorrow to discuss options.

## 2016-06-30 NOTE — Progress Notes (Signed)
Pre visit review using our clinic review tool, if applicable. No additional management support is needed unless otherwise documented below in the visit note. 

## 2016-06-30 NOTE — Assessment & Plan Note (Signed)
Encouraged heart healthy diet, increase exercise, avoid trans fats, consider a krill oil cap daily 

## 2016-06-30 NOTE — Assessment & Plan Note (Signed)
Encouraged to get adequate exercise, calcium and vitamin d intake 

## 2016-06-30 NOTE — Progress Notes (Signed)
Patient ID: Kaitlyn Hanna, female   DOB: 09-26-1940, 75 y.o.   MRN: [id number]   Subjective:    Patient ID: Kaitlyn Hanna, female    DOB: 1940-09-23, 75 y.o.   MRN: [id number]  Chief Complaint  Patient presents with  . Annual Exam    HPI Patient is in today for an annual exam with no acute concerns. Recent audiology testing has confirmed hearing loss in right ear. No recent illness. No acute concerns or hospitalization. Is doing well with ADLs and is maintaining a heart healthy diet. Denies CP/palp/SOB/HA/congestion/fevers/GI or GU c/o. Taking meds as prescribed  Past Medical History:  Diagnosis Date  . Benign schwannoma 06/30/2016  . H/O measles   . Hearing loss in right ear 04/14/2016  . Preventative health care 06/30/2016  . Raynaud's phenomenon 04/14/2016  . Schwannoma of cranial nerve ([place]) 06/30/2016    Past Surgical History:  Procedure Laterality Date  . ABDOMINAL HYSTERECTOMY     age 22 TAH b/l SPO for endometriosis  . TONSILLECTOMY      Family History  Problem Relation Age of Onset  . Heart disease Mother     MI  . Cancer Father     lung cancer, small cell, smoker  . Hypertension Sister   . Dementia Sister   . Alcohol abuse Brother   . Cancer Maternal Grandmother   . Dementia Sister   . Arthritis Sister     Rheumtoid   . Alzheimer's disease Sister   . Cancer Brother     Social History   Social History  . Marital status: Married    Spouse name: N/A  . Number of children: N/A  . Years of education: N/A   Occupational History  . Not on file.   Social History Main Topics  . Smoking status: Never Smoker  . Smokeless tobacco: Never Used  . Alcohol use No  . Drug use: No  . Sexual activity: Not on file   Other Topics Concern  . Not on file   Social History Narrative   Lives with husband   Retired from medical office work with [employer]   No major dietary restrictions   Exercises daily. Walks and [employer] deep water exercise   Raised 2 stepkids, son and daughter      Outpatient Medications Prior to Visit  Medication Sig Dispense Refill  . Multiple Vitamins-Minerals (MULTIVITAMIN WITH MINERALS) tablet Take 1 tablet by mouth daily.    Marland Kitchen Specialty Vitamins Products (ONE-A-DAY BONE STRENGTH PO) Take by mouth 3 (three) times daily.     No facility-administered medications prior to visit.     No Known Allergies  Review of Systems  Constitutional: Negative for fever.  HENT: Positive for hearing loss.   Eyes: Negative for blurred vision.  Respiratory: Negative for cough and shortness of breath.   Cardiovascular: Negative for chest pain and palpitations.  Gastrointestinal: Negative for vomiting.  Musculoskeletal: Negative for back pain.  Skin: Negative for rash.  Neurological: Negative for loss of consciousness and headaches.       Objective:    Physical Exam  Constitutional: She is oriented to person, place, and time. She appears well-developed and well-nourished. No distress.  HENT:  Head: Normocephalic and atraumatic.  Eyes: Conjunctivae are normal.  Neck: Normal range of motion. No thyromegaly present.  Cardiovascular: Normal rate and regular rhythm.   Pulmonary/Chest: Effort normal and breath sounds normal. She has no wheezes.  Abdominal: Soft. Bowel sounds are normal. There is no tenderness.  Musculoskeletal: Normal range of motion. She exhibits no edema or deformity.  Neurological: She is alert and oriented to person, place, and time.  Skin: Skin is warm and dry. She is not diaphoretic.  Psychiatric: She has a normal mood and affect.    There were no vitals taken for this visit. Wt Readings from Last 3 Encounters:  04/14/16 154 lb 9.6 oz (70.1 kg)    Assessment & Plan:   Problem List Items Addressed This Visit    Hearing loss in right ear    Went to [facility] for evaluation of right ear hearing loss. Was referred to Dr Benjamine Mola of ENT had a repeat hearing test, confirmed unilateral hearing loss and was sent for MRI       Hyperlipidemia, mixed - Primary    Encouraged heart healthy diet, increase exercise, avoid trans fats, consider a krill oil cap daily      Relevant Orders   TSH   Osteopenia    Encouraged to get adequate exercise, calcium and vitamin d intake      Relevant Orders   Comp Met (CMET)   CBC   Preventative health care    Patient encouraged to maintain heart healthy diet, regular exercise, adequate sleep. Consider daily probiotics. Take medications as prescribed. Requested copy of ACP documents she has done      Relevant Orders   TSH   Comp Met (CMET)   CBC   Lipid panel   Benign schwannoma    Right internal auditory canal, has an appt with her ENT Dr Benjamine Mola tomorrow to discuss options.      Relevant Orders   TSH   Comp Met (CMET)   CBC   Lipid panel      I am having [name] maintain her multivitamin with minerals and Specialty Vitamins Products (ONE-A-DAY BONE STRENGTH PO).  No orders of the defined types were placed in this encounter.    Penni Homans, MD

## 2016-06-30 NOTE — Patient Instructions (Signed)
Preventive Care for Adults, Female A healthy lifestyle and preventive care can promote health and wellness. Preventive health guidelines for women include the following key practices.  A routine yearly physical is a good way to check with your health care provider about your health and preventive screening. It is a chance to share any concerns and updates on your health and to receive a thorough exam.  Visit your dentist for a routine exam and preventive care every 6 months. Brush your teeth twice a day and floss once a day. Good oral hygiene prevents tooth decay and gum disease.  The frequency of eye exams is based on your age, health, family medical history, use of contact lenses, and other factors. Follow your health care provider's recommendations for frequency of eye exams.  Eat a healthy diet. Foods like vegetables, fruits, whole grains, low-fat dairy products, and lean protein foods contain the nutrients you need without too many calories. Decrease your intake of foods high in solid fats, added sugars, and salt. Eat the right amount of calories for you.Get information about a proper diet from your health care provider, if necessary.  Regular physical exercise is one of the most important things you can do for your health. Most adults should get at least 150 minutes of moderate-intensity exercise (any activity that increases your heart rate and causes you to sweat) each week. In addition, most adults need muscle-strengthening exercises on 2 or more days a week.  Maintain a healthy weight. The body mass index (BMI) is a screening tool to identify possible weight problems. It provides an estimate of body fat based on height and weight. Your health care provider can find your BMI and can help you achieve or maintain a healthy weight.For adults 20 years and older:  A BMI below 18.5 is considered underweight.  A BMI of 18.5 to 24.9 is normal.  A BMI of 25 to 29.9 is considered overweight.  A  BMI of 30 and above is considered obese.  Maintain normal blood lipids and cholesterol levels by exercising and minimizing your intake of saturated fat. Eat a balanced diet with plenty of fruit and vegetables. Blood tests for lipids and cholesterol should begin at age 45 and be repeated every 5 years. If your lipid or cholesterol levels are high, you are over 50, or you are at high risk for heart disease, you may need your cholesterol levels checked more frequently.Ongoing high lipid and cholesterol levels should be treated with medicines if diet and exercise are not working.  If you smoke, find out from your health care provider how to quit. If you do not use tobacco, do not start.  Lung cancer screening is recommended for adults aged 45-80 years who are at high risk for developing lung cancer because of a history of smoking. A yearly low-dose CT scan of the lungs is recommended for people who have at least a 30-pack-year history of smoking and are a current smoker or have quit within the past 15 years. A pack year of smoking is smoking an average of 1 pack of cigarettes a day for 1 year (for example: 1 pack a day for 30 years or 2 packs a day for 15 years). Yearly screening should continue until the smoker has stopped smoking for at least 15 years. Yearly screening should be stopped for people who develop a health problem that would prevent them from having lung cancer treatment.  If you are pregnant, do not drink alcohol. If you are  breastfeeding, be very cautious about drinking alcohol. If you are not pregnant and choose to drink alcohol, do not have more than 1 drink per day. One drink is considered to be 12 ounces (355 mL) of beer, 5 ounces (148 mL) of wine, or 1.5 ounces (44 mL) of liquor.  Avoid use of street drugs. Do not share needles with anyone. Ask for help if you need support or instructions about stopping the use of drugs.  High blood pressure causes heart disease and increases the risk  of stroke. Your blood pressure should be checked at least every 1 to 2 years. Ongoing high blood pressure should be treated with medicines if weight loss and exercise do not work.  If you are 55-79 years old, ask your health care provider if you should take aspirin to prevent strokes.  Diabetes screening is done by taking a blood sample to check your blood glucose level after you have not eaten for a certain period of time (fasting). If you are not overweight and you do not have risk factors for diabetes, you should be screened once every 3 years starting at age 45. If you are overweight or obese and you are 40-70 years of age, you should be screened for diabetes every year as part of your cardiovascular risk assessment.  Breast cancer screening is essential preventive care for women. You should practice "breast self-awareness." This means understanding the normal appearance and feel of your breasts and may include breast self-examination. Any changes detected, no matter how small, should be reported to a health care provider. Women in their 20s and 30s should have a clinical breast exam (CBE) by a health care provider as part of a regular health exam every 1 to 3 years. After age 40, women should have a CBE every year. Starting at age 40, women should consider having a mammogram (breast X-ray test) every year. Women who have a family history of breast cancer should talk to their health care provider about genetic screening. Women at a high risk of breast cancer should talk to their health care providers about having an MRI and a mammogram every year.  Breast cancer gene (BRCA)-related cancer risk assessment is recommended for women who have family members with BRCA-related cancers. BRCA-related cancers include breast, ovarian, tubal, and peritoneal cancers. Having family members with these cancers may be associated with an increased risk for harmful changes (mutations) in the breast cancer genes BRCA1 and  BRCA2. Results of the assessment will determine the need for genetic counseling and BRCA1 and BRCA2 testing.  Your health care provider may recommend that you be screened regularly for cancer of the pelvic organs (ovaries, uterus, and vagina). This screening involves a pelvic examination, including checking for microscopic changes to the surface of your cervix (Pap test). You may be encouraged to have this screening done every 3 years, beginning at age 21.  For women ages 30-65, health care providers may recommend pelvic exams and Pap testing every 3 years, or they may recommend the Pap and pelvic exam, combined with testing for human papilloma virus (HPV), every 5 years. Some types of HPV increase your risk of cervical cancer. Testing for HPV may also be done on women of any age with unclear Pap test results.  Other health care providers may not recommend any screening for nonpregnant women who are considered low risk for pelvic cancer and who do not have symptoms. Ask your health care provider if a screening pelvic exam is right for   you.  If you have had past treatment for cervical cancer or a condition that could lead to cancer, you need Pap tests and screening for cancer for at least 20 years after your treatment. If Pap tests have been discontinued, your risk factors (such as having a new sexual partner) need to be reassessed to determine if screening should resume. Some women have medical problems that increase the chance of getting cervical cancer. In these cases, your health care provider may recommend more frequent screening and Pap tests.  Colorectal cancer can be detected and often prevented. Most routine colorectal cancer screening begins at the age of 50 years and continues through age 75 years. However, your health care provider may recommend screening at an earlier age if you have risk factors for colon cancer. On a yearly basis, your health care provider may provide home test kits to check  for hidden blood in the stool. Use of a small camera at the end of a tube, to directly examine the colon (sigmoidoscopy or colonoscopy), can detect the earliest forms of colorectal cancer. Talk to your health care provider about this at age 50, when routine screening begins. Direct exam of the colon should be repeated every 5-10 years through age 75 years, unless early forms of precancerous polyps or small growths are found.  People who are at an increased risk for hepatitis B should be screened for this virus. You are considered at high risk for hepatitis B if:  You were born in a country where hepatitis B occurs often. Talk with your health care provider about which countries are considered high risk.  Your parents were born in a high-risk country and you have not received a shot to protect against hepatitis B (hepatitis B vaccine).  You have HIV or AIDS.  You use needles to inject street drugs.  You live with, or have sex with, someone who has hepatitis B.  You get hemodialysis treatment.  You take certain medicines for conditions like cancer, organ transplantation, and autoimmune conditions.  Hepatitis C blood testing is recommended for all people born from 1945 through 1965 and any individual with known risks for hepatitis C.  Practice safe sex. Use condoms and avoid high-risk sexual practices to reduce the spread of sexually transmitted infections (STIs). STIs include gonorrhea, chlamydia, syphilis, trichomonas, herpes, HPV, and human immunodeficiency virus (HIV). Herpes, HIV, and HPV are viral illnesses that have no cure. They can result in disability, cancer, and death.  You should be screened for sexually transmitted illnesses (STIs) including gonorrhea and chlamydia if:  You are sexually active and are younger than 24 years.  You are older than 24 years and your health care provider tells you that you are at risk for this type of infection.  Your sexual activity has changed  since you were last screened and you are at an increased risk for chlamydia or gonorrhea. Ask your health care provider if you are at risk.  If you are at risk of being infected with HIV, it is recommended that you take a prescription medicine daily to prevent HIV infection. This is called preexposure prophylaxis (PrEP). You are considered at risk if:  You are sexually active and do not regularly use condoms or know the HIV status of your partner(s).  You take drugs by injection.  You are sexually active with a partner who has HIV.  Talk with your health care provider about whether you are at high risk of being infected with HIV. If   you choose to begin PrEP, you should first be tested for HIV. You should then be tested every 3 months for as long as you are taking PrEP.  Osteoporosis is a disease in which the bones lose minerals and strength with aging. This can result in serious bone fractures or breaks. The risk of osteoporosis can be identified using a bone density scan. Women ages 67 years and over and women at risk for fractures or osteoporosis should discuss screening with their health care providers. Ask your health care provider whether you should take a calcium supplement or vitamin D to reduce the rate of osteoporosis.  Menopause can be associated with physical symptoms and risks. Hormone replacement therapy is available to decrease symptoms and risks. You should talk to your health care provider about whether hormone replacement therapy is right for you.  Use sunscreen. Apply sunscreen liberally and repeatedly throughout the day. You should seek shade when your shadow is shorter than you. Protect yourself by wearing long sleeves, pants, a wide-brimmed hat, and sunglasses year round, whenever you are outdoors.  Once a month, do a whole body skin exam, using a mirror to look at the skin on your back. Tell your health care provider of new moles, moles that have irregular borders, moles that  are larger than a pencil eraser, or moles that have changed in shape or color.  Stay current with required vaccines (immunizations).  Influenza vaccine. All adults should be immunized every year.  Tetanus, diphtheria, and acellular pertussis (Td, Tdap) vaccine. Pregnant women should receive 1 dose of Tdap vaccine during each pregnancy. The dose should be obtained regardless of the length of time since the last dose. Immunization is preferred during the 27th-36th week of gestation. An adult who has not previously received Tdap or who does not know her vaccine status should receive 1 dose of Tdap. This initial dose should be followed by tetanus and diphtheria toxoids (Td) booster doses every 10 years. Adults with an unknown or incomplete history of completing a 3-dose immunization series with Td-containing vaccines should begin or complete a primary immunization series including a Tdap dose. Adults should receive a Td booster every 10 years.  Varicella vaccine. An adult without evidence of immunity to varicella should receive 2 doses or a second dose if she has previously received 1 dose. Pregnant females who do not have evidence of immunity should receive the first dose after pregnancy. This first dose should be obtained before leaving the health care facility. The second dose should be obtained 4-8 weeks after the first dose.  Human papillomavirus (HPV) vaccine. Females aged 13-26 years who have not received the vaccine previously should obtain the 3-dose series. The vaccine is not recommended for use in pregnant females. However, pregnancy testing is not needed before receiving a dose. If a female is found to be pregnant after receiving a dose, no treatment is needed. In that case, the remaining doses should be delayed until after the pregnancy. Immunization is recommended for any person with an immunocompromised condition through the age of 61 years if she did not get any or all doses earlier. During the  3-dose series, the second dose should be obtained 4-8 weeks after the first dose. The third dose should be obtained 24 weeks after the first dose and 16 weeks after the second dose.  Zoster vaccine. One dose is recommended for adults aged 30 years or older unless certain conditions are present.  Measles, mumps, and rubella (MMR) vaccine. Adults born  before 1957 generally are considered immune to measles and mumps. Adults born in 1957 or later should have 1 or more doses of MMR vaccine unless there is a contraindication to the vaccine or there is laboratory evidence of immunity to each of the three diseases. A routine second dose of MMR vaccine should be obtained at least 28 days after the first dose for students attending postsecondary schools, health care workers, or international travelers. People who received inactivated measles vaccine or an unknown type of measles vaccine during 1963-1967 should receive 2 doses of MMR vaccine. People who received inactivated mumps vaccine or an unknown type of mumps vaccine before 1979 and are at high risk for mumps infection should consider immunization with 2 doses of MMR vaccine. For females of childbearing age, rubella immunity should be determined. If there is no evidence of immunity, females who are not pregnant should be vaccinated. If there is no evidence of immunity, females who are pregnant should delay immunization until after pregnancy. Unvaccinated health care workers born before 1957 who lack laboratory evidence of measles, mumps, or rubella immunity or laboratory confirmation of disease should consider measles and mumps immunization with 2 doses of MMR vaccine or rubella immunization with 1 dose of MMR vaccine.  Pneumococcal 13-valent conjugate (PCV13) vaccine. When indicated, a person who is uncertain of his immunization history and has no record of immunization should receive the PCV13 vaccine. All adults 65 years of age and older should receive this  vaccine. An adult aged 19 years or older who has certain medical conditions and has not been previously immunized should receive 1 dose of PCV13 vaccine. This PCV13 should be followed with a dose of pneumococcal polysaccharide (PPSV23) vaccine. Adults who are at high risk for pneumococcal disease should obtain the PPSV23 vaccine at least 8 weeks after the dose of PCV13 vaccine. Adults older than 73 years of age who have normal immune system function should obtain the PPSV23 vaccine dose at least 1 year after the dose of PCV13 vaccine.  Pneumococcal polysaccharide (PPSV23) vaccine. When PCV13 is also indicated, PCV13 should be obtained first. All adults aged 65 years and older should be immunized. An adult younger than age 65 years who has certain medical conditions should be immunized. Any person who resides in a nursing home or long-term care facility should be immunized. An adult smoker should be immunized. People with an immunocompromised condition and certain other conditions should receive both PCV13 and PPSV23 vaccines. People with human immunodeficiency virus (HIV) infection should be immunized as soon as possible after diagnosis. Immunization during chemotherapy or radiation therapy should be avoided. Routine use of PPSV23 vaccine is not recommended for American Indians, [place] Natives, or people younger than 65 years unless there are medical conditions that require PPSV23 vaccine. When indicated, people who have unknown immunization and have no record of immunization should receive PPSV23 vaccine. One-time revaccination 5 years after the first dose of PPSV23 is recommended for people aged 19-64 years who have chronic kidney failure, nephrotic syndrome, asplenia, or immunocompromised conditions. People who received 1-2 doses of PPSV23 before age 65 years should receive another dose of PPSV23 vaccine at age 65 years or later if at least 5 years have passed since the previous dose. Doses of PPSV23 are not  needed for people immunized with PPSV23 at or after age 65 years.  Meningococcal vaccine. Adults with asplenia or persistent complement component deficiencies should receive 2 doses of quadrivalent meningococcal conjugate (MenACWY-D) vaccine. The doses should be obtained   at least 2 months apart. Microbiologists working with certain meningococcal bacteria, [place] recruits, people at risk during an outbreak, and people who travel to or live in countries with a high rate of meningitis should be immunized. A first-year college student up through age 34 years who is living in a residence hall should receive a dose if she did not receive a dose on or after her 16th birthday. Adults who have certain high-risk conditions should receive one or more doses of vaccine.  Hepatitis A vaccine. Adults who wish to be protected from this disease, have certain high-risk conditions, work with hepatitis A-infected animals, work in hepatitis A research labs, or travel to or work in countries with a high rate of hepatitis A should be immunized. Adults who were previously unvaccinated and who anticipate close contact with an international adoptee during the first 60 days after arrival in the [place] from a country with a high rate of hepatitis A should be immunized.  Hepatitis B vaccine. Adults who wish to be protected from this disease, have certain high-risk conditions, may be exposed to blood or other infectious body fluids, are household contacts or sex partners of hepatitis B positive people, are clients or workers in certain care facilities, or travel to or work in countries with a high rate of hepatitis B should be immunized.  Haemophilus influenzae type b (Hib) vaccine. A previously unvaccinated person with asplenia or sickle cell disease or having a scheduled splenectomy should receive 1 dose of Hib vaccine. Regardless of previous immunization, a recipient of a hematopoietic stem cell transplant should receive a  3-dose series 6-12 months after her successful transplant. Hib vaccine is not recommended for adults with HIV infection. Preventive Services / Frequency Ages 35 to 4 years  Blood pressure check.** / Every 3-5 years.  Lipid and cholesterol check.** / Every 5 years beginning at age 60.  Clinical breast exam.** / Every 3 years for women in their 71s and 10s.  BRCA-related cancer risk assessment.** / For women who have family members with a BRCA-related cancer (breast, ovarian, tubal, or peritoneal cancers).  Pap test.** / Every 2 years from ages 76 through 26. Every 3 years starting at age 61 through age 76 or 93 with a history of 3 consecutive normal Pap tests.  HPV screening.** / Every 3 years from ages 37 through ages 60 to 51 with a history of 3 consecutive normal Pap tests.  Hepatitis C blood test.** / For any individual with known risks for hepatitis C.  Skin self-exam. / Monthly.  Influenza vaccine. / Every year.  Tetanus, diphtheria, and acellular pertussis (Tdap, Td) vaccine.** / Consult your health care provider. Pregnant women should receive 1 dose of Tdap vaccine during each pregnancy. 1 dose of Td every 10 years.  Varicella vaccine.** / Consult your health care provider. Pregnant females who do not have evidence of immunity should receive the first dose after pregnancy.  HPV vaccine. / 3 doses over 6 months, if 93 and younger. The vaccine is not recommended for use in pregnant females. However, pregnancy testing is not needed before receiving a dose.  Measles, mumps, rubella (MMR) vaccine.** / You need at least 1 dose of MMR if you were born in 1957 or later. You may also need a 2nd dose. For females of childbearing age, rubella immunity should be determined. If there is no evidence of immunity, females who are not pregnant should be vaccinated. If there is no evidence of immunity, females who are  pregnant should delay immunization until after pregnancy.  Pneumococcal  13-valent conjugate (PCV13) vaccine.** / Consult your health care provider.  Pneumococcal polysaccharide (PPSV23) vaccine.** / 1 to 2 doses if you smoke cigarettes or if you have certain conditions.  Meningococcal vaccine.** / 1 dose if you are age 68 to 8 years and a Market researcher living in a residence hall, or have one of several medical conditions, you need to get vaccinated against meningococcal disease. You may also need additional booster doses.  Hepatitis A vaccine.** / Consult your health care provider.  Hepatitis B vaccine.** / Consult your health care provider.  Haemophilus influenzae type b (Hib) vaccine.** / Consult your health care provider. Ages 7 to 53 years  Blood pressure check.** / Every year.  Lipid and cholesterol check.** / Every 5 years beginning at age 25 years.  Lung cancer screening. / Every year if you are aged 11-80 years and have a 30-pack-year history of smoking and currently smoke or have quit within the past 15 years. Yearly screening is stopped once you have quit smoking for at least 15 years or develop a health problem that would prevent you from having lung cancer treatment.  Clinical breast exam.** / Every year after age 48 years.  BRCA-related cancer risk assessment.** / For women who have family members with a BRCA-related cancer (breast, ovarian, tubal, or peritoneal cancers).  Mammogram.** / Every year beginning at age 41 years and continuing for as long as you are in good health. Consult with your health care provider.  Pap test.** / Every 3 years starting at age 65 years through age 37 or 70 years with a history of 3 consecutive normal Pap tests.  HPV screening.** / Every 3 years from ages 72 years through ages 60 to 40 years with a history of 3 consecutive normal Pap tests.  Fecal occult blood test (FOBT) of stool. / Every year beginning at age 21 years and continuing until age 5 years. You may not need to do this test if you get  a colonoscopy every 10 years.  Flexible sigmoidoscopy or colonoscopy.** / Every 5 years for a flexible sigmoidoscopy or every 10 years for a colonoscopy beginning at age 35 years and continuing until age 48 years.  Hepatitis C blood test.** / For all people born from [birth year] through 1965 and any individual with known risks for hepatitis C.  Skin self-exam. / Monthly.  Influenza vaccine. / Every year.  Tetanus, diphtheria, and acellular pertussis (Tdap/Td) vaccine.** / Consult your health care provider. Pregnant women should receive 1 dose of Tdap vaccine during each pregnancy. 1 dose of Td every 10 years.  Varicella vaccine.** / Consult your health care provider. Pregnant females who do not have evidence of immunity should receive the first dose after pregnancy.  Zoster vaccine.** / 1 dose for adults aged 30 years or older.  Measles, mumps, rubella (MMR) vaccine.** / You need at least 1 dose of MMR if you were born in 1957 or later. You may also need a second dose. For females of childbearing age, rubella immunity should be determined. If there is no evidence of immunity, females who are not pregnant should be vaccinated. If there is no evidence of immunity, females who are pregnant should delay immunization until after pregnancy.  Pneumococcal 13-valent conjugate (PCV13) vaccine.** / Consult your health care provider.  Pneumococcal polysaccharide (PPSV23) vaccine.** / 1 to 2 doses if you smoke cigarettes or if you have certain conditions.  Meningococcal vaccine.** /  Consult your health care provider.  Hepatitis A vaccine.** / Consult your health care provider.  Hepatitis B vaccine.** / Consult your health care provider.  Haemophilus influenzae type b (Hib) vaccine.** / Consult your health care provider. Ages 64 years and over  Blood pressure check.** / Every year.  Lipid and cholesterol check.** / Every 5 years beginning at age 23 years.  Lung cancer screening. / Every year if you  are aged 16-80 years and have a 30-pack-year history of smoking and currently smoke or have quit within the past 15 years. Yearly screening is stopped once you have quit smoking for at least 15 years or develop a health problem that would prevent you from having lung cancer treatment.  Clinical breast exam.** / Every year after age 74 years.  BRCA-related cancer risk assessment.** / For women who have family members with a BRCA-related cancer (breast, ovarian, tubal, or peritoneal cancers).  Mammogram.** / Every year beginning at age 44 years and continuing for as long as you are in good health. Consult with your health care provider.  Pap test.** / Every 3 years starting at age 58 years through age 22 or 39 years with 3 consecutive normal Pap tests. Testing can be stopped between 65 and 70 years with 3 consecutive normal Pap tests and no abnormal Pap or HPV tests in the past 10 years.  HPV screening.** / Every 3 years from ages 64 years through ages 70 or 61 years with a history of 3 consecutive normal Pap tests. Testing can be stopped between 65 and 70 years with 3 consecutive normal Pap tests and no abnormal Pap or HPV tests in the past 10 years.  Fecal occult blood test (FOBT) of stool. / Every year beginning at age 40 years and continuing until age 27 years. You may not need to do this test if you get a colonoscopy every 10 years.  Flexible sigmoidoscopy or colonoscopy.** / Every 5 years for a flexible sigmoidoscopy or every 10 years for a colonoscopy beginning at age 7 years and continuing until age 32 years.  Hepatitis C blood test.** / For all people born from [birth year] through 1965 and any individual with known risks for hepatitis C.  Osteoporosis screening.** / A one-time screening for women ages 30 years and over and women at risk for fractures or osteoporosis.  Skin self-exam. / Monthly.  Influenza vaccine. / Every year.  Tetanus, diphtheria, and acellular pertussis (Tdap/Td)  vaccine.** / 1 dose of Td every 10 years.  Varicella vaccine.** / Consult your health care provider.  Zoster vaccine.** / 1 dose for adults aged 35 years or older.  Pneumococcal 13-valent conjugate (PCV13) vaccine.** / Consult your health care provider.  Pneumococcal polysaccharide (PPSV23) vaccine.** / 1 dose for all adults aged 46 years and older.  Meningococcal vaccine.** / Consult your health care provider.  Hepatitis A vaccine.** / Consult your health care provider.  Hepatitis B vaccine.** / Consult your health care provider.  Haemophilus influenzae type b (Hib) vaccine.** / Consult your health care provider. ** Family history and personal history of risk and conditions may change your health care provider's recommendations.   This information is not intended to replace advice given to you by your health care provider. Make sure you discuss any questions you have with your health care provider.   Document Released: 10/06/2001 Document Revised: 08/31/2014 Document Reviewed: 01/05/2011 Elsevier Interactive Patient Education [employer].

## 2016-07-01 ENCOUNTER — Telehealth: Payer: Self-pay | Admitting: Family Medicine

## 2016-07-01 DIAGNOSIS — H9041 Sensorineural hearing loss, unilateral, right ear, with unrestricted hearing on the contralateral side: Secondary | ICD-10-CM | POA: Diagnosis not present

## 2016-07-01 DIAGNOSIS — D333 Benign neoplasm of cranial nerves: Secondary | ICD-10-CM | POA: Diagnosis not present

## 2016-07-01 NOTE — Telephone Encounter (Signed)
°  Relation to [id number] Call back number:[phone] Pharmacy:  Reason for call: pt was seen yesterday and states Dr. Charlett Blake was going to put an order in for her to have a colonoscopy done, however pt states when she got home she found her documents of when she had one last, pt had it on 08/01/2008 so she is not due for another one until 2019. Wanted to inform Dr. Charlett Blake states if she has any questions she can call her [phone]

## 2016-07-01 NOTE — Telephone Encounter (Signed)
FYI

## 2016-07-01 NOTE — Telephone Encounter (Signed)
Can we abstract this colonoscopy please

## 2016-07-02 ENCOUNTER — Telehealth: Payer: Self-pay | Admitting: Family Medicine

## 2016-07-02 NOTE — Telephone Encounter (Signed)
Have called [facility] at [phone] informed to cancel order.  They had shipped already and informed once patient receives the kit to just recycle it and they did cancel the order so patient will not be charged. Documented colonoscopy done on 08/01/2008 next due 08/01/2018 Patient informed of all information. 

## 2016-07-02 NOTE — Telephone Encounter (Signed)
Patient called stating that she does not need the col[facility] test and she spoke with a nurse this morning letting them know that she did not need it. After speaking with the nurse she received a message letting her know that the colorg[facility] test is on its way to her. She would like it canceled because she does not want to end up paying for it. Please advise.

## 2016-07-02 NOTE — Telephone Encounter (Signed)
Updated colonoscopy in heatlh maintenance.  Canceled [facility]oguard test.  They had already mailed to her, but [facility] rep informed me to tell the patient once she received the kit to just recycle it and she will not be charged.

## 2016-07-11 LAB — HM DIABETES FOOT EXAM: HM Diabetic Foot Exam: NORMAL

## 2016-08-19 ENCOUNTER — Encounter: Payer: Self-pay | Admitting: Family Medicine

## 2016-08-20 ENCOUNTER — Encounter: Payer: Self-pay | Admitting: Family Medicine

## 2016-10-05 ENCOUNTER — Ambulatory Visit: Payer: Medicare Other | Admitting: Family Medicine

## 2017-01-04 ENCOUNTER — Other Ambulatory Visit (INDEPENDENT_AMBULATORY_CARE_PROVIDER_SITE_OTHER): Payer: Self-pay | Admitting: Otolaryngology

## 2017-01-04 DIAGNOSIS — D333 Benign neoplasm of cranial nerves: Secondary | ICD-10-CM

## 2017-01-12 ENCOUNTER — Ambulatory Visit
Admission: RE | Admit: 2017-01-12 | Discharge: 2017-01-12 | Disposition: A | Discharge status: Home / Self Care | Payer: Medicare Other | Source: Ambulatory Visit | Attending: Otolaryngology | Admitting: Otolaryngology

## 2017-01-12 DIAGNOSIS — D333 Benign neoplasm of cranial nerves: Secondary | ICD-10-CM

## 2017-01-12 DIAGNOSIS — D3610 Benign neoplasm of peripheral nerves and autonomic nervous system, unspecified: Secondary | ICD-10-CM | POA: Diagnosis not present

## 2017-01-12 MED ORDER — GADOBENATE DIMEGLUMINE 529 MG/ML IV SOLN
15.0000 mL | Freq: Once | INTRAVENOUS | Status: AC | PRN
  Administered 2017-01-12: 15 mL via INTRAVENOUS

## 2017-01-20 DIAGNOSIS — H9041 Sensorineural hearing loss, unilateral, right ear, with unrestricted hearing on the contralateral side: Secondary | ICD-10-CM | POA: Diagnosis not present

## 2017-01-20 DIAGNOSIS — D333 Benign neoplasm of cranial nerves: Secondary | ICD-10-CM | POA: Diagnosis not present

## 2017-01-29 DIAGNOSIS — Z01419 Encounter for gynecological examination (general) (routine) without abnormal findings: Secondary | ICD-10-CM | POA: Diagnosis not present

## 2017-01-29 DIAGNOSIS — Z1231 Encounter for screening mammogram for malignant neoplasm of breast: Secondary | ICD-10-CM | POA: Diagnosis not present

## 2017-01-29 DIAGNOSIS — Z1389 Encounter for screening for other disorder: Secondary | ICD-10-CM | POA: Diagnosis not present

## 2017-01-29 DIAGNOSIS — Z130 Encounter for screening for diseases of the blood and blood-forming organs and certain disorders involving the immune mechanism: Secondary | ICD-10-CM | POA: Diagnosis not present

## 2017-01-29 LAB — HM MAMMOGRAPHY

## 2017-05-18 ENCOUNTER — Telehealth: Payer: Self-pay | Admitting: Family Medicine

## 2017-05-18 NOTE — Telephone Encounter (Signed)
Relation to pt: self  Call back number:[phone]   Reason for call:  Patient conducted AWV with [facility] nurse 12/2015 and states she would like to continue to do so but she will ensure Medicare nurse communicates with PCP and faxes over report

## 2017-06-15 ENCOUNTER — Ambulatory Visit (INDEPENDENT_AMBULATORY_CARE_PROVIDER_SITE_OTHER): Payer: Medicare Other

## 2017-06-15 ENCOUNTER — Ambulatory Visit (INDEPENDENT_AMBULATORY_CARE_PROVIDER_SITE_OTHER): Payer: Medicare Other | Admitting: Orthopaedic Surgery

## 2017-06-15 ENCOUNTER — Encounter (INDEPENDENT_AMBULATORY_CARE_PROVIDER_SITE_OTHER): Payer: Self-pay | Admitting: Orthopaedic Surgery

## 2017-06-15 VITALS — BP 142/78 | HR 65 | Resp 12 | Ht 61.0 in | Wt 161.0 lb

## 2017-06-15 DIAGNOSIS — G8929 Other chronic pain: Secondary | ICD-10-CM | POA: Diagnosis not present

## 2017-06-15 DIAGNOSIS — M25561 Pain in right knee: Secondary | ICD-10-CM | POA: Diagnosis not present

## 2017-06-15 NOTE — Progress Notes (Signed)
Office Visit Note   Patient: Kaitlyn Hanna           Date of Birth: [birth year]           MRN: [id number] Visit Date: 06/15/2017              Requested by: [name], MD [id number] [place], [place] [address] PCP: [name], MD   Assessment & Plan: Visit Diagnoses:  1. Chronic pain of right knee     Plan: Films of the right knee demonstrate mild to moderate degenerative changes consistent with osteoarthritis. Presently she is asymptomatic. Long discussion regarding the x-ray findings and the suspicion that her problem is related to the arthritis and what she may expect over time. She does take ibuprofen that makes a big difference. Right now because she is asymptomatic she really doesn't need any specific treatment. We can always consider cortisone and even Visco supplementation over time. We've also discussed use of a of a pullover knee support in the cooler temperatures we'll plan to see her back as needed  Follow-Up Instructions: No Follow-up on file.   Orders:  Orders Placed This Encounter  Procedures  . XR KNEE 3 VIEW RIGHT   No orders of the defined types were placed in this encounter.     Procedures: No procedures performed   Clinical Data: No additional findings.   Subjective: No chief complaint on file. Rather acute onset of right knee pain approximally 5 days ago without history of injury or trauma. Pain was "unbearable" to the point that it compromised her ambulation. There is no history of injury or trauma fever chills or skin rashes. Pain is somewhat diffuse and localized to the knee. No back pain. No groin or thigh pain. Within the past 24-48 hours the pain has subsided to the point where she no longer has any discomfort.There was no numbness or tingling or trouble with her back  HPI  Review of Systems   Objective: Vital Signs: BP (!) 142/78 (BP Location: Right Arm, Patient Position: Sitting, Cuff Size: Large)   Pulse 65   Resp  12   Ht 5\' 1"  (1.549 m)   Wt 161 lb (73 kg)   LMP  (LMP Unknown)   BMI 30.42 kg/m   Physical Exam  Ortho Exam awake alert and oriented 3. Comfortable sitting. Increased varus right knee but without significant medial lateral joint pain. Positive patellar crepitation with flexion and extension. No instability. Full extension and about 105-6 of flexion. Some popliteal fullness but no pain. No calf pain. Good pulses distally no swelling. Painless range of motion of both hips with internal/external rotation. Straight leg raise negative  Specialty Comments:  No specialty comments available.  Imaging: No results found.   PMFS History: Patient Active Problem List   Diagnosis Date Noted  . Preventative health care 06/30/2016  . Benign schwannoma 06/30/2016  . Hearing loss in right ear 04/14/2016  . Raynaud's phenomenon 04/14/2016  . Hyperlipidemia, mixed 04/14/2016  . Osteopenia 04/14/2016   Past Medical History:  Diagnosis Date  . Benign schwannoma 06/30/2016  . H/O measles   . Hearing loss in right ear 04/14/2016  . Preventative health care 06/30/2016  . Raynaud's phenomenon 04/14/2016  . Schwannoma of cranial nerve ([place]) 06/30/2016    Family History  Problem Relation Age of Onset  . Heart disease Mother        MI  . Cancer Father  lung cancer, small cell, smoker  . Hypertension Sister   . Dementia Sister   . Alcohol abuse Brother   . Cancer Maternal Grandmother   . Dementia Sister   . Arthritis Sister        Rheumtoid   . Alzheimer's disease Sister   . Cancer Brother     Past Surgical History:  Procedure Laterality Date  . ABDOMINAL HYSTERECTOMY     age 52 TAH b/l SPO for endometriosis  . TONSILLECTOMY    . WISDOM TOOTH EXTRACTION     Social History   Occupational History  . Not on file.   Social History Main Topics  . Smoking status: Never Smoker  . Smokeless tobacco: Never Used  . Alcohol use No  . Drug use: No  . Sexual activity: Not on file      Garald Balding, MD   Note - This record has been created using [employer].  Chart creation errors have been sought, but may not always  have been located. Such creation errors do not reflect on  the standard of medical care.

## 2017-07-05 ENCOUNTER — Encounter: Payer: Medicare Other | Admitting: Family Medicine

## 2017-07-13 ENCOUNTER — Ambulatory Visit: Payer: Medicare Other | Admitting: Family Medicine

## 2017-07-29 ENCOUNTER — Encounter: Payer: Self-pay | Admitting: Family Medicine

## 2017-07-29 ENCOUNTER — Ambulatory Visit (INDEPENDENT_AMBULATORY_CARE_PROVIDER_SITE_OTHER): Payer: Medicare Other | Admitting: Family Medicine

## 2017-07-29 DIAGNOSIS — E782 Mixed hyperlipidemia: Secondary | ICD-10-CM

## 2017-07-29 DIAGNOSIS — M8580 Other specified disorders of bone density and structure, unspecified site: Secondary | ICD-10-CM

## 2017-07-29 DIAGNOSIS — Z0000 Encounter for general adult medical examination without abnormal findings: Secondary | ICD-10-CM

## 2017-07-29 DIAGNOSIS — H9191 Unspecified hearing loss, right ear: Secondary | ICD-10-CM

## 2017-07-29 NOTE — Progress Notes (Signed)
Subjective:  I acted as a Education administrator for [employer]. Kaitlyn Hanna, [place]   Patient ID: Kaitlyn Hanna, female    DOB: 03-21-1941, 76 y.o.   MRN: [id number]  No chief complaint on file.   HPI  Patient is in today for annual exam and follow up on chronic medical concerns including hyperlipidemia, osteopenia and right ear schwannoma.  Overall she feels well.  No recent hospitalization for acute febrile illness.  She is following with Dr. Benjamine Mola of ear nose and throat for her right ear lesion.  Does note hearing loss in the right ear but no tinnitus or pain.  She is doing well with her activities of daily living and tries to maintain a heart healthy diet.  Stays as active as she tolerates. Denies CP/palp/SOB/HA/congestion/fevers/GI or GU c/o. Taking meds as prescribed  Patient Care Team: [name], MD as PCP - General (Family Medicine)   Past Medical History:  Diagnosis Date  . Benign schwannoma 06/30/2016  . H/O measles   . Hearing loss in right ear 04/14/2016  . Preventative health care 06/30/2016  . Raynaud's phenomenon 04/14/2016  . Schwannoma of cranial nerve ([place]) 06/30/2016    Past Surgical History:  Procedure Laterality Date  . ABDOMINAL HYSTERECTOMY     age 40 TAH b/l SPO for endometriosis  . TONSILLECTOMY    . WISDOM TOOTH EXTRACTION      Family History  Problem Relation Age of Onset  . Heart disease Mother        MI  . Cancer Father        lung cancer, small cell, smoker  . Hypertension Sister   . Dementia Sister   . Alcohol abuse Brother   . Cancer Maternal Grandmother   . Dementia Sister   . Arthritis Sister        Rheumtoid   . Alzheimer's disease Sister   . Cancer Brother     Social History   Socioeconomic History  . Marital status: Married    Spouse name: Not on file  . Number of children: Not on file  . Years of education: Not on file  . Highest education level: Not on file  Social Needs  . Financial resource strain: Not on file  . Food insecurity - worry:  Not on file  . Food insecurity - inability: Not on file  . Transportation needs - medical: Not on file  . Transportation needs - non-medical: Not on file  Occupational History  . Not on file  Tobacco Use  . Smoking status: Never Smoker  . Smokeless tobacco: Never Used  Substance and Sexual Activity  . Alcohol use: No  . Drug use: No  . Sexual activity: Not on file  Other Topics Concern  . 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 Walks and Y deep water exercise   Raised 2 stepkids, son and daughter    Outpatient Medications Prior to Visit  Medication Sig Dispense Refill  . Influenza vac split quadrivalent PF (FLUZONE HIGH-DOSE) 0.5 ML injection Fluzone High-Dose 2016-2017 (PF) 180 mcg/0.5 mL intramuscular syringe  ADM 0.5ML IM UTD    . Multiple Vitamins-Minerals (MULTIVITAMIN WITH MINERALS) tablet Take 1 tablet by mouth daily.    Marland Kitchen Specialty Vitamins Products (ONE-A-DAY BONE STRENGTH PO) Take by mouth 3 (three) times daily.     No facility-administered medications prior to  visit.     No Known Allergies  Review of Systems  Constitutional: Negative for chills, fever and malaise/fatigue.  HENT: Positive for hearing loss. Negative for congestion.   Eyes: Negative for discharge.  Respiratory: Negative for cough, sputum production and shortness of breath.   Cardiovascular: Negative for chest pain, palpitations and leg swelling.  Gastrointestinal: Negative for abdominal pain, blood in stool, constipation, diarrhea, heartburn, nausea and vomiting.  Genitourinary: Negative for dysuria, frequency, hematuria and urgency.  Musculoskeletal: Negative for back pain, falls and myalgias.  Skin: Negative for rash.  Neurological: Negative for dizziness, sensory change, loss of consciousness, weakness and headaches.  Endo/Heme/Allergies: Negative for environmental allergies. Does  not bruise/bleed easily.  Psychiatric/Behavioral: Negative for depression and suicidal ideas. The patient is not nervous/anxious and does not have insomnia.        Objective:    Physical Exam  Constitutional: She is oriented to person, place, and time. She appears well-developed and well-nourished. No distress.  HENT:  Head: Normocephalic and atraumatic.  Eyes: Conjunctivae are normal.  Neck: Neck supple. No thyromegaly present.  Cardiovascular: Normal rate, regular rhythm and normal heart sounds.  No murmur heard. Pulmonary/Chest: Effort normal and breath sounds normal. No respiratory distress.  Abdominal: Soft. Bowel sounds are normal. She exhibits no distension and no mass. There is no tenderness.  Musculoskeletal: She exhibits no edema.  Lymphadenopathy:    She has no cervical adenopathy.  Neurological: She is alert and oriented to person, place, and time.  Skin: Skin is warm and dry.  Psychiatric: She has a normal mood and affect. Her behavior is normal.    LMP  (LMP Unknown)  Wt Readings from Last 3 Encounters:  06/15/17 161 lb (73 kg)  04/14/16 154 lb 9.6 oz (70.1 kg)   BP Readings from Last 3 Encounters:  06/15/17 (!) 142/78  04/14/16 108/62     Immunization History  Administered Date(s) Administered  . Influenza, High Dose Seasonal PF 05/29/2017  . Influenza-Unspecified 06/22/2016    Health Maintenance  Topic Date Due  . TETANUS/TDAP  01/09/1962  . DEXA SCAN  01/10/2008  . PNA vac Low Risk Adult (1 of 2 - PCV13) 01/10/2008  . MAMMOGRAM  11/15/2015  . COLONOSCOPY  08/01/2018  . INFLUENZA VACCINE  Completed    Lab Results  Component Value Date   WBC 6.5 06/30/2016   HGB 12.3 06/30/2016   HCT 36.9 06/30/2016   PLT 256.0 06/30/2016   GLUCOSE 87 06/30/2016   CHOL 211 (H) 06/30/2016   TRIG 104.0 06/30/2016   HDL 48.40 06/30/2016   LDLCALC 142 (H) 06/30/2016   ALT 13 06/30/2016   AST 18 06/30/2016   NA 137 06/30/2016   K 3.9 06/30/2016   CL 102  06/30/2016   CREATININE 1.04 06/30/2016   BUN 18 06/30/2016   CO2 28 06/30/2016   TSH 1.15 06/30/2016    Lab Results  Component Value Date   TSH 1.15 06/30/2016   Lab Results  Component Value Date   WBC 6.5 06/30/2016   HGB 12.3 06/30/2016   HCT 36.9 06/30/2016   MCV 87.5 06/30/2016   PLT 256.0 06/30/2016   Lab Results  Component Value Date   NA 137 06/30/2016   K 3.9 06/30/2016   CO2 28 06/30/2016   GLUCOSE 87 06/30/2016   BUN 18 06/30/2016   CREATININE 1.04 06/30/2016   BILITOT 0.4 06/30/2016   ALKPHOS 81 06/30/2016   AST 18 06/30/2016   ALT 13 06/30/2016   PROT  7.4 06/30/2016   ALBUMIN 4.0 06/30/2016   CALCIUM 9.5 06/30/2016   GFR 55.14 (L) 06/30/2016   Lab Results  Component Value Date   CHOL 211 (H) 06/30/2016   Lab Results  Component Value Date   HDL 48.40 06/30/2016   Lab Results  Component Value Date   LDLCALC 142 (H) 06/30/2016   Lab Results  Component Value Date   TRIG 104.0 06/30/2016   Lab Results  Component Value Date   CHOLHDL 4 06/30/2016   No results found for: HGBA1C       Assessment & Plan:   Problem List Items Addressed This Visit    None      I am having Kaitlyn Hanna maintain her multivitamin with minerals, Specialty Vitamins Products (ONE-A-DAY BONE STRENGTH PO), and Influenza vac split quadrivalent PF.  No orders of the defined types were placed in this encounter.   CMA served as Education administrator during this visit. History, Physical and Plan performed by medical provider. Documentation and orders reviewed and attested to.  Bartholome Bill, RMA

## 2017-07-29 NOTE — Assessment & Plan Note (Signed)
Patient encouraged to maintain heart healthy diet, regular exercise, adequate sleep. Consider daily probiotics. Take medications as prescribed 

## 2017-07-29 NOTE — Assessment & Plan Note (Signed)
Encouraged heart healthy diet, increase exercise, avoid trans fats, consider a krill oil cap daily 

## 2017-07-29 NOTE — Patient Instructions (Addendum)
shingrix is the new shingles shot, 2 shots 2 to six months later, at pharmacy Bring [place] a copy of your advanced directives Learn something Probiotics daily for helath Learn something new  Preventive Care 65 Years and Older, Female Preventive care refers to lifestyle choices and visits with your health care provider that can promote health and wellness. What does preventive care include?  A yearly physical exam. This is also called an annual well check.  Dental exams once or twice a year.  Routine eye exams. Ask your health care provider how often you should have your eyes checked.  Personal lifestyle choices, including: ? Daily care of your teeth and gums. ? Regular physical activity. ? Eating a healthy diet. ? Avoiding tobacco and drug use. ? Limiting alcohol use. ? Practicing safe sex. ? Taking low-dose aspirin every day. ? Taking vitamin and mineral supplements as recommended by your health care provider. What happens during an annual well check? The services and screenings done by your health care provider during your annual well check will depend on your age, overall health, lifestyle risk factors, and family history of disease. Counseling Your health care provider may ask you questions about your:  Alcohol use.  Tobacco use.  Drug use.  Emotional well-being.  Home and relationship well-being.  Sexual activity.  Eating habits.  History of falls.  Memory and ability to understand (cognition).  Work and work Statistician.  Reproductive health.  Screening You may have the following tests or measurements:  Height, weight, and BMI.  Blood pressure.  Lipid and cholesterol levels. These may be checked every 5 years, or more frequently if you are over 52 years old.  Skin check.  Lung cancer screening. You may have this screening every year starting at age 53 if you have a 30-pack-year history of smoking and currently smoke or have quit within the past 15  years.  Fecal occult blood test (FOBT) of the stool. You may have this test every year starting at age 45.  Flexible sigmoidoscopy or colonoscopy. You may have a sigmoidoscopy every 5 years or a colonoscopy every 10 years starting at age 33.  Hepatitis C blood test.  Hepatitis B blood test.  Sexually transmitted disease (STD) testing.  Diabetes screening. This is done by checking your blood sugar (glucose) after you have not eaten for a while (fasting). You may have this done every 1-3 years.  Bone density scan. This is done to screen for osteoporosis. You may have this done starting at age 63.  Mammogram. This may be done every 1-2 years. Talk to your health care provider about how often you should have regular mammograms.  Talk with your health care provider about your test results, treatment options, and if necessary, the need for more tests. Vaccines Your health care provider may recommend certain vaccines, such as:  Influenza vaccine. This is recommended every year.  Tetanus, diphtheria, and acellular pertussis (Tdap, Td) vaccine. You may need a Td booster every 10 years.  Varicella vaccine. You may need this if you have not been vaccinated.  Zoster vaccine. You may need this after age 54.  Measles, mumps, and rubella (MMR) vaccine. You may need at least one dose of MMR if you were born in 1957 or later. You may also need a second dose.  Pneumococcal 13-valent conjugate (PCV13) vaccine. One dose is recommended after age 29.  Pneumococcal polysaccharide (PPSV23) vaccine. One dose is recommended after age 69.  Meningococcal vaccine. You may need this  if you have certain conditions.  Hepatitis A vaccine. You may need this if you have certain conditions or if you travel or work in places where you may be exposed to hepatitis A.  Hepatitis B vaccine. You may need this if you have certain conditions or if you travel or work in places where you may be exposed to hepatitis  B.  Haemophilus influenzae type b (Hib) vaccine. You may need this if you have certain conditions.  Talk to your health care provider about which screenings and vaccines you need and how often you need them. This information is not intended to replace advice given to you by your health care provider. Make sure you discuss any questions you have with your health care provider. Document Released: 09/06/2015 Document Revised: 04/29/2016 Document Reviewed: 06/11/2015 Elsevier Interactive Patient Education  2017 [employer].

## 2017-07-29 NOTE — Assessment & Plan Note (Signed)
Encouraged to get adequate exercise, calcium and vitamin d intake 

## 2017-07-30 LAB — CBC
HEMATOCRIT: 36.2 % (ref 36.0–46.0)
HEMOGLOBIN: 11.8 g/dL — AB (ref 12.0–15.0)
MCHC: 32.7 g/dL (ref 30.0–36.0)
MCV: 88.9 fl (ref 78.0–100.0)
Platelets: 237 10*3/uL (ref 150.0–400.0)
RBC: 4.08 Mil/uL (ref 3.87–5.11)
RDW: 14 % (ref 11.5–15.5)
WBC: 6.2 10*3/uL (ref 4.0–10.5)

## 2017-07-30 LAB — TSH: TSH: 3.88 u[IU]/mL (ref 0.35–4.50)

## 2017-07-30 LAB — LIPID PANEL
CHOL/HDL RATIO: 5
Cholesterol: 182 mg/dL (ref 0–200)
HDL: 38.5 mg/dL — AB (ref >39.00)
LDL Cholesterol: 106 mg/dL — ABNORMAL HIGH (ref 0–99)
NONHDL: 143.46
TRIGLYCERIDES: 187 mg/dL — AB (ref 0.0–149.0)
VLDL: 37.4 mg/dL (ref 0.0–40.0)

## 2017-07-30 LAB — COMPREHENSIVE METABOLIC PANEL
ALT: 13 U/L (ref 0–35)
AST: 19 U/L (ref 0–37)
Albumin: 4 g/dL (ref 3.5–5.2)
Alkaline Phosphatase: 91 U/L (ref 39–117)
BUN: 18 mg/dL (ref 6–23)
CALCIUM: 8.9 mg/dL (ref 8.4–10.5)
CHLORIDE: 103 meq/L (ref 96–112)
CO2: 27 meq/L (ref 19–32)
Creatinine, Ser: 0.92 mg/dL (ref 0.40–1.20)
GFR: 63.33 mL/min (ref >60.00)
GLUCOSE: 86 mg/dL (ref 70–99)
POTASSIUM: 4 meq/L (ref 3.5–5.1)
Sodium: 139 mEq/L (ref 135–145)
Total Bilirubin: 0.3 mg/dL (ref 0.2–1.2)
Total Protein: 7 g/dL (ref 6.0–8.3)

## 2017-08-03 NOTE — Assessment & Plan Note (Signed)
She follows with Dr Benjamine Mola of ENT and they are deciding how to handle lesion in her right ear.

## 2017-08-25 ENCOUNTER — Ambulatory Visit (HOSPITAL_BASED_OUTPATIENT_CLINIC_OR_DEPARTMENT_OTHER)
Admission: RE | Admit: 2017-08-25 | Discharge: 2017-08-25 | Disposition: A | Discharge status: Home / Self Care | Payer: Medicare Other | Source: Ambulatory Visit | Attending: Family Medicine | Admitting: Family Medicine

## 2017-08-25 DIAGNOSIS — Z780 Asymptomatic menopausal state: Secondary | ICD-10-CM | POA: Diagnosis not present

## 2017-08-25 DIAGNOSIS — M8580 Other specified disorders of bone density and structure, unspecified site: Secondary | ICD-10-CM

## 2017-08-25 DIAGNOSIS — M810 Age-related osteoporosis without current pathological fracture: Secondary | ICD-10-CM | POA: Insufficient documentation

## 2017-09-14 DIAGNOSIS — H04123 Dry eye syndrome of bilateral lacrimal glands: Secondary | ICD-10-CM | POA: Diagnosis not present

## 2017-09-14 DIAGNOSIS — H52223 Regular astigmatism, bilateral: Secondary | ICD-10-CM | POA: Diagnosis not present

## 2017-09-14 DIAGNOSIS — H524 Presbyopia: Secondary | ICD-10-CM | POA: Diagnosis not present

## 2017-09-14 DIAGNOSIS — H5203 Hypermetropia, bilateral: Secondary | ICD-10-CM | POA: Diagnosis not present

## 2017-09-14 DIAGNOSIS — H43813 Vitreous degeneration, bilateral: Secondary | ICD-10-CM | POA: Diagnosis not present

## 2017-12-28 ENCOUNTER — Other Ambulatory Visit (INDEPENDENT_AMBULATORY_CARE_PROVIDER_SITE_OTHER): Payer: Self-pay | Admitting: Otolaryngology

## 2017-12-28 DIAGNOSIS — D333 Benign neoplasm of cranial nerves: Secondary | ICD-10-CM

## 2018-01-03 ENCOUNTER — Ambulatory Visit
Admission: RE | Admit: 2018-01-03 | Discharge: 2018-01-03 | Disposition: A | Discharge status: Home / Self Care | Payer: Medicare Other | Source: Ambulatory Visit | Attending: Otolaryngology | Admitting: Otolaryngology

## 2018-01-03 DIAGNOSIS — D333 Benign neoplasm of cranial nerves: Secondary | ICD-10-CM | POA: Diagnosis not present

## 2018-01-03 MED ORDER — GADOBENATE DIMEGLUMINE 529 MG/ML IV SOLN
14.0000 mL | Freq: Once | INTRAVENOUS | Status: AC | PRN
  Administered 2018-01-03: 14 mL via INTRAVENOUS

## 2018-01-14 IMAGING — MR MR HEAD WO/W CM
8 of 11 series · 34 of 48 positions shown · IV contrast (multihance)
Comparison: None.

CLINICAL DATA: RIGHT-sided hearing loss for 7 years.

EXAM:
MRI HEAD WITHOUT AND WITH CONTRAST
TECHNIQUE: Multiplanar, multiecho pulse sequences of the brain and surrounding
structures were obtained without and with intravenous contrast.
CONTRAST:  15mL MULTIHANCE GADOBENATE DIMEGLUMINE 529 MG/ML IV SOLN
Creatinine was obtained on site at [HOSPITAL] at [HOSPITAL].
Results: Creatinine 1.0 mg/dL.

[Series 2: T1 · sagittal · 5.0mm · 0.45mm/px · 3 of 19 slices shown]
[im 1/19]
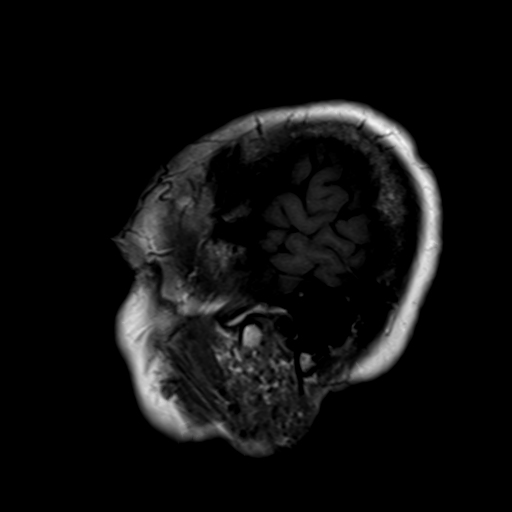
[im 10/19]
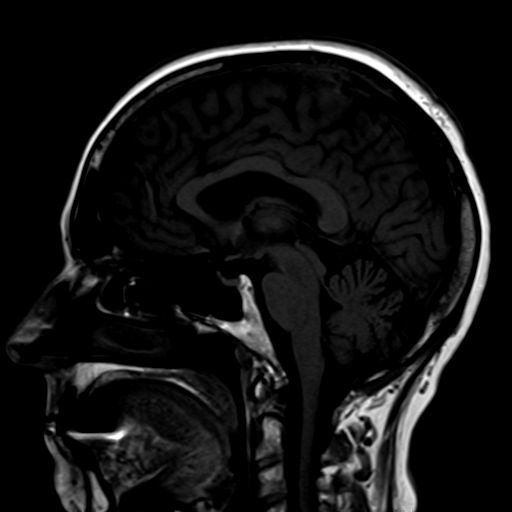
[im 19/19]
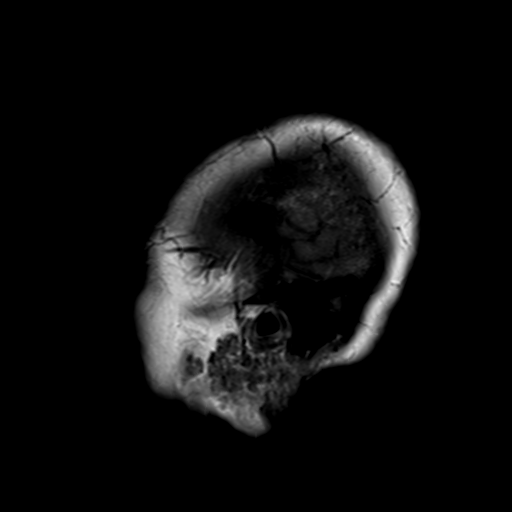

[Series 3: ep2d_diff_(id)_trace · axial · 3.0mm · 1.80mm/px · z∈[-60,+84]mm · 11 of 96 slices shown]
[im 1/96]
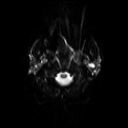
[im 10/96]
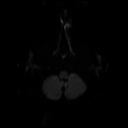
[im 20/96]
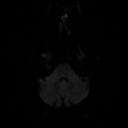
[im 29/96]
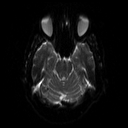
[im 39/96]
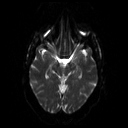
[im 48/96]
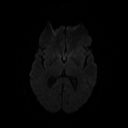
[im 58/96]
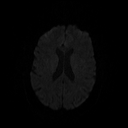
[im 67/96]
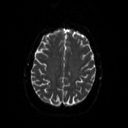
[im 77/96]
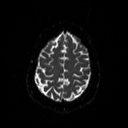
[im 86/96]
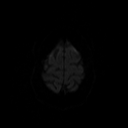
[im 96/96]
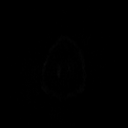

[Series 4: ep2d_diff_(id)_trace_adc · axial · 3.0mm · 1.80mm/px · z∈[-63,+84]mm · 6 of 50 slices shown]
[im 1/50]
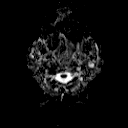
[im 10/50]
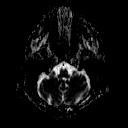
[im 20/50]
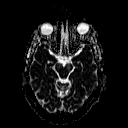
[im 30/50]
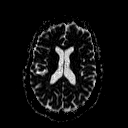
[im 40/50]
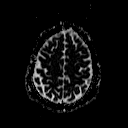
[im 50/50]
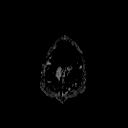

[Series 5: T2 · axial · 5.0mm · 0.45mm/px · z∈[-61,+76]mm · 3 of 22 slices shown]
[im 1/22]
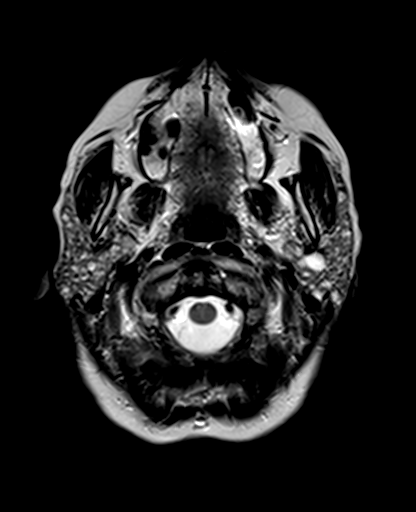
[im 11/22]
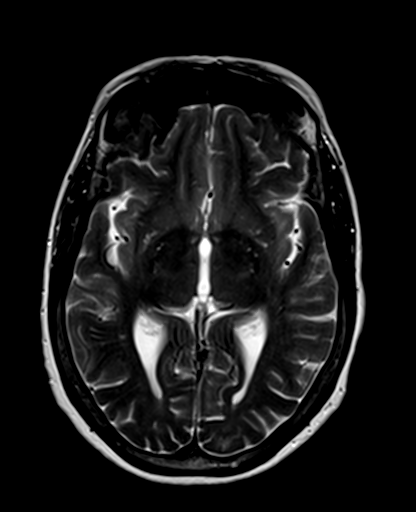
[im 22/22]
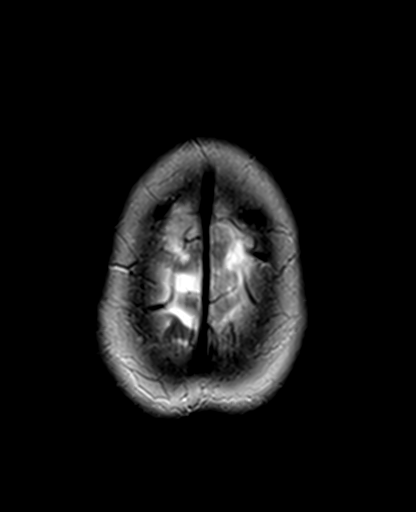

[Series 6: FLAIR · axial · 5.0mm · 0.45mm/px · z∈[-61,+76]mm · 3 of 22 slices shown]
[im 1/22]
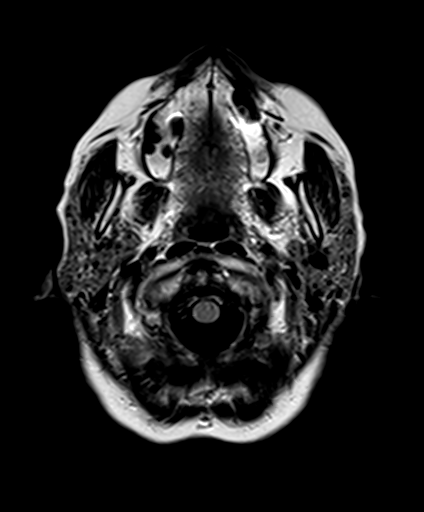
[im 11/22]
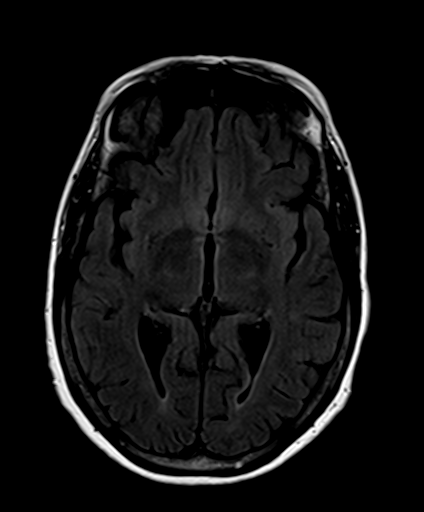
[im 22/22]
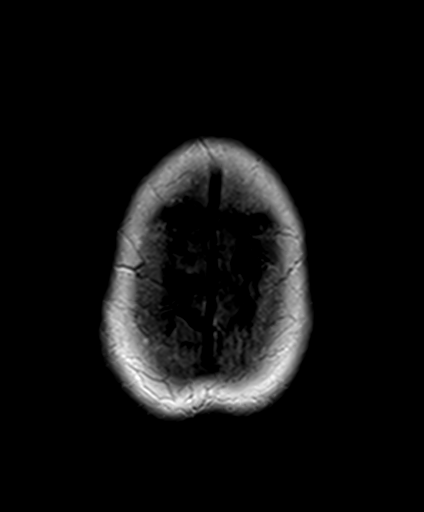

[Series 7: cor 3mm · coronal · 3.0mm · 0.56mm/px · 2 of 18 slices shown]
[im 1/18]
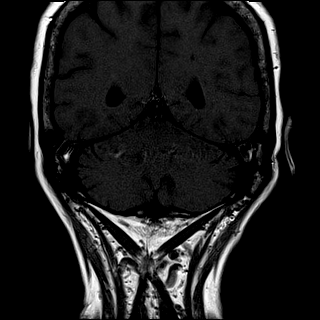
[im 18/18]
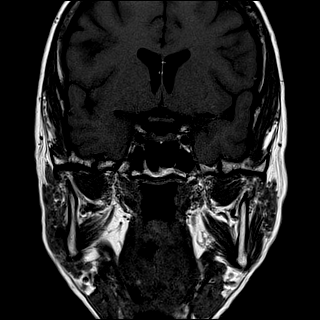

[Series 8: axial 3mm · axial · 3.0mm · 0.56mm/px · z∈[-53,+3]mm · 2 of 18 slices shown]
[im 1/18]
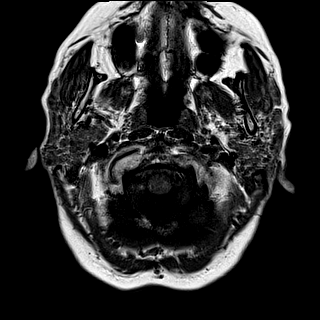
[im 18/18]
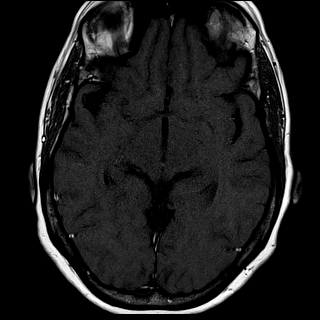

[Series 9: bSSFP · axial · 0.7mm · 0.23mm/px · z∈[-40,-17]mm · 4 of 44 slices shown]
[im 1/44]
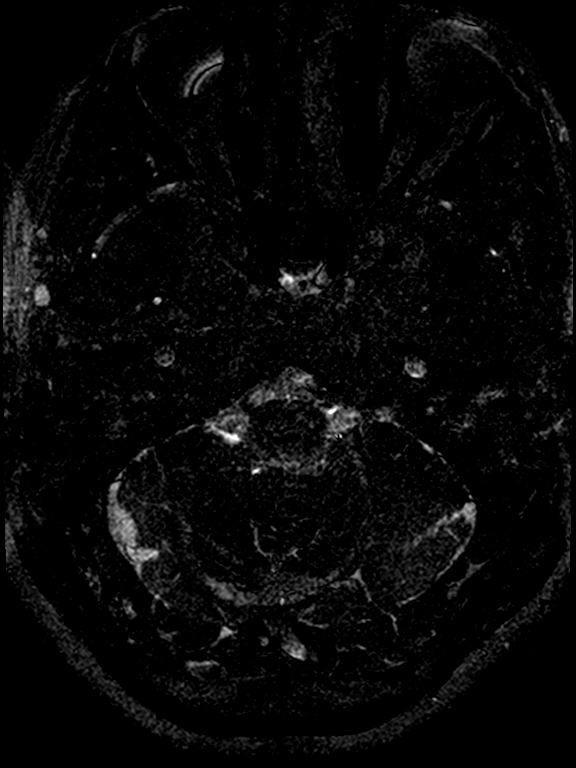
[im 11/44]
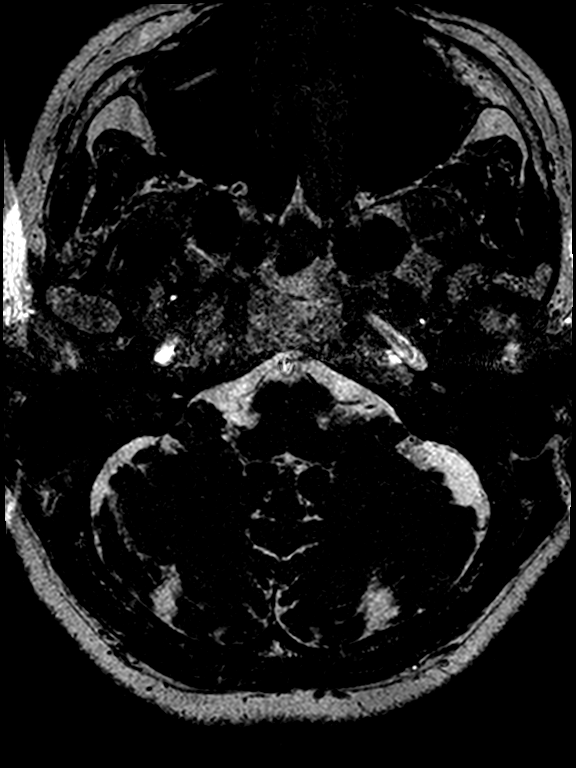
[im 22/44]
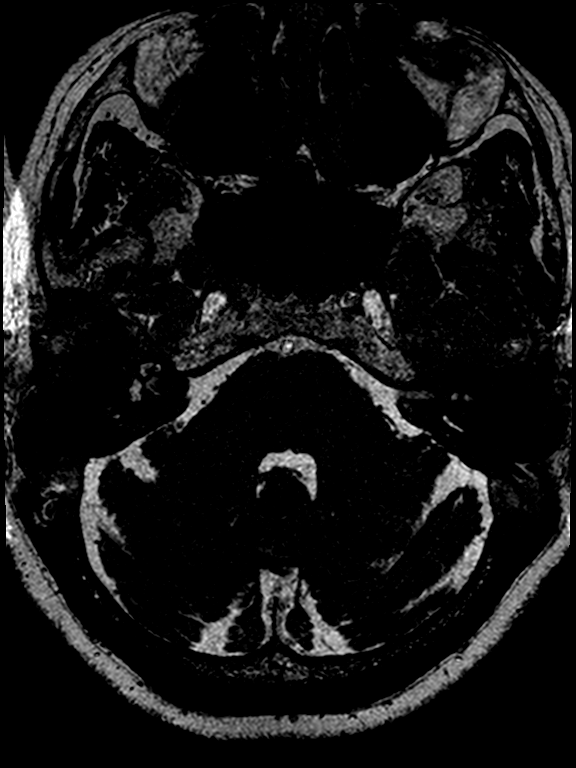
[im 33/44]
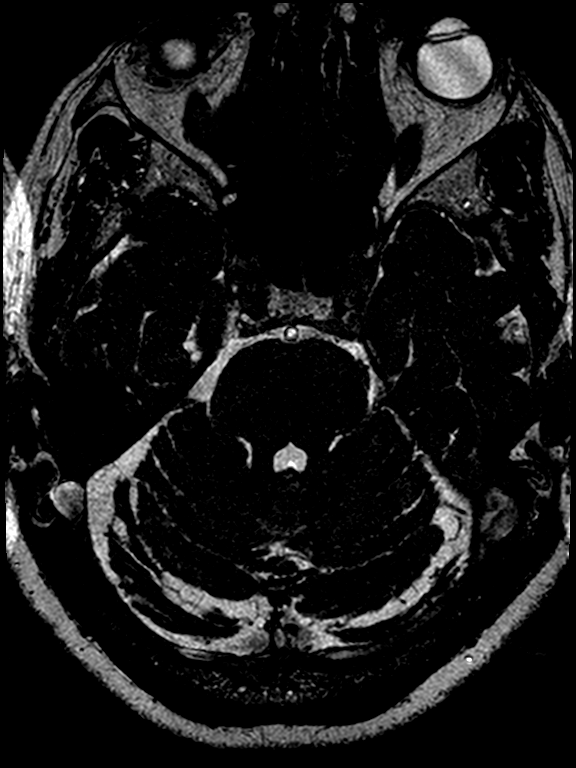

[34 of 48 positions shown; findings below may reference images not displayed]

FINDINGS: Brain: No evidence for acute infarction, hemorrhage, intra-axial
mass lesion, hydrocephalus, or extra-axial fluid. Mild atrophy. No
significant white matter disease.

Thin-section imaging through the posterior fossa demonstrates a
solid enhancing lesion, mildly expanding the RIGHT internal auditory
canal, measuring 8 x 6 x 5 mm (R-L x A-P x C-C). No extension into
the cochlea or CP angle cistern. No similar lesions elsewhere. No
temporal bone inflammatory process.

Vascular: Flow voids are maintained. Major dural venous sinuses are
patent.

Skull and upper cervical spine: Normal marrow signal.

Sinuses/Orbits: No layering fluid or significant opacity. BILATERAL
cataract extraction.

Other: Multiple small lesions are seen throughout both parotid
glands, predominantly cystic. No dominant solid lesion. Correlate
clinically for Warthin's tumors, benign lymphoepithelial lesions,
Sjogren's syndrome, or chronic obstruction.
IMPRESSION: Findings consistent with a 8 x 6 x 5 mm RIGHT vestibular schwannoma,
confined to the internal auditory canal.

Multiple small lesions are seen throughout both parotid glands,
predominantly cystic. No dominant solid lesion. See discussion
above.

## 2018-01-25 DIAGNOSIS — H6121 Impacted cerumen, right ear: Secondary | ICD-10-CM | POA: Diagnosis not present

## 2018-01-25 DIAGNOSIS — D333 Benign neoplasm of cranial nerves: Secondary | ICD-10-CM | POA: Diagnosis not present

## 2018-01-25 DIAGNOSIS — H903 Sensorineural hearing loss, bilateral: Secondary | ICD-10-CM | POA: Diagnosis not present

## 2018-02-14 DIAGNOSIS — Z01419 Encounter for gynecological examination (general) (routine) without abnormal findings: Secondary | ICD-10-CM | POA: Diagnosis not present

## 2018-02-21 DIAGNOSIS — Z1231 Encounter for screening mammogram for malignant neoplasm of breast: Secondary | ICD-10-CM | POA: Diagnosis not present

## 2018-02-21 LAB — HM MAMMOGRAPHY

## 2018-08-05 ENCOUNTER — Ambulatory Visit: Payer: Medicare Other | Admitting: Family Medicine

## 2018-08-05 ENCOUNTER — Encounter: Payer: Self-pay | Admitting: Family Medicine

## 2018-08-05 DIAGNOSIS — J069 Acute upper respiratory infection, unspecified: Secondary | ICD-10-CM | POA: Diagnosis not present

## 2018-08-05 NOTE — Assessment & Plan Note (Signed)
  Symptomatic therapy suggested: push fluids, rest, use vaporizer or mist prn and return office visit prn if symptoms persist or worsen. Lack of antibiotic effectiveness discussed with her. Call or return to clinic prn if these symptoms worsen or fail to improve as anticipated.

## 2018-08-05 NOTE — Progress Notes (Signed)
Kaitlyn Hanna - 77 y.o. female MRN [id number]  Date of birth: 1941/08/16  Subjective Chief Complaint  Patient presents with  . Cough    started two days ago. Non-productive cough. Denies SOB. She has been taking Robitussin.     HPI  Kaitlyn Hanna is a 77 y.o. female who complains of congestion, sore throat, nasal blockage and post nasal drip for 4 days. She denies a history of chest pain, chills, fevers, myalgias, nausea, vomiting, weakness, weight loss, wheezing, cough and sputum production and denies a history of asthma. Patient does not smoke cigarettes.  She is not taking anything for treatment at this time.   ROS:  A comprehensive ROS was completed and negative except as noted per HPI    No Known Allergies  Past Medical History:  Diagnosis Date  . Benign schwannoma 06/30/2016  . H/O measles   . Hearing loss in right ear 04/14/2016  . Preventative health care 06/30/2016  . Raynaud's phenomenon 04/14/2016  . Schwannoma of cranial nerve ([place]) 06/30/2016    Past Surgical History:  Procedure Laterality Date  . ABDOMINAL HYSTERECTOMY     age 57 TAH b/l SPO for endometriosis  . TONSILLECTOMY    . WISDOM TOOTH EXTRACTION      Social History   Socioeconomic History  . Marital status: Married    Spouse name: Not on file  . Number of children: Not on file  . Years of education: Not on file  . Highest education level: Not on file  Occupational History  . Not on file  Social Needs  . Financial resource strain: Not on file  . Food insecurity:    Worry: Not on file    Inability: Not on file  . Transportation needs:    Medical: Not on file    Non-medical: Not on file  Tobacco Use  . Smoking status: Never Smoker  . Smokeless tobacco: Never Used  Substance and Sexual Activity  . Alcohol use: No  . Drug use: No  . Sexual activity: Not on file  Lifestyle  . Physical activity:    Days per week: Not on file    Minutes per session: Not on file  . Stress: Not on file    Relationships  . Social connections:    Talks on phone: Not on file    Gets together: Not on file    Attends religious service: Not on file    Active member of club or organization: Not on file    Attends meetings of clubs or organizations: Not on file    Relationship status: Not on file  Other Topics Concern  . 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 Walks and Y deep water exercise   Raised 2 stepkids, son and daughter    Family History  Problem Relation Age of Onset  . Heart disease Mother        MI  . Cancer Father        lung cancer, small cell, smoker  . Hypertension Sister   . Dementia Sister   . Alcohol abuse Brother   . Cancer Maternal Grandmother   . Dementia Sister   . Arthritis Sister        Rheumtoid   . Alzheimer's disease Sister   . Cancer Brother     Health Maintenance  Topic Date Due  .  TETANUS/TDAP  01/09/1962  . PNA vac Low Risk Adult (1 of 2 - PCV13) 01/10/2008  . COLONOSCOPY  08/01/2018  . INFLUENZA VACCINE  Completed  . DEXA SCAN  Completed    ----------------------------------------------------------------------------------------------------------------------------------------------------------------------------------------------------------------- Physical Exam BP 119/62   Pulse 84   Temp 98 F (36.7 C) (Oral)   Ht 5' 1.02" (1.55 m)   Wt 142 lb (64.4 kg)   LMP  (LMP Unknown)   SpO2 97%   BMI 26.81 kg/m   Physical Exam Constitutional:      Appearance: Normal appearance.  HENT:     Head: Normocephalic and atraumatic.     Right Ear: Tympanic membrane normal.     Left Ear: Tympanic membrane normal.     Nose: Congestion present.     Mouth/Throat:     Mouth: Mucous membranes are moist.     Pharynx: Oropharynx is clear. No oropharyngeal exudate.  Eyes:     General: No scleral icterus. Neck:     Musculoskeletal: Neck  supple.  Cardiovascular:     Rate and Rhythm: Normal rate and regular rhythm.  Pulmonary:     Effort: Pulmonary effort is normal.     Breath sounds: Normal breath sounds.  Lymphadenopathy:     Cervical: No cervical adenopathy.  Neurological:     General: No focal deficit present.     Mental Status: She is alert.  Psychiatric:        Mood and Affect: Mood normal.        Behavior: Behavior normal.     ------------------------------------------------------------------------------------------------------------------------------------------------------------------------------------------------------------------- Assessment and Plan  URI (upper respiratory infection)  Symptomatic therapy suggested: push fluids, rest, use vaporizer or mist prn and return office visit prn if symptoms persist or worsen. Lack of antibiotic effectiveness discussed with her. Call or return to clinic prn if these symptoms worsen or fail to improve as anticipated.

## 2018-08-25 ENCOUNTER — Ambulatory Visit: Payer: Self-pay

## 2018-08-25 NOTE — Telephone Encounter (Signed)
Call placed to patient who states that she had a cold and fever beginning of December.  She states that all symptoms have gone but she continues with an annoying hacking cough.  She states she is taking Robitussin DM and Mucinex DM. And sucking on cough drops. She states nothing is helping. Appointment scheduled per protocol. Care advice read to patient. Pt verbalized understanding of all instructions. Reason for Disposition . Cough has been present for > 3 weeks  Answer Assessment - Initial Assessment Questions 1. ONSET: "When did the cough begin?"      1st of December with cold and fever 2. SEVERITY: "How bad is the cough today?"      Dry tickling agravating 3. RESPIRATORY DISTRESS: "Describe your breathing."      No 4. FEVER: "Do you have a fever?" If so, ask: "What is your temperature, how was it measured, and when did it start?"     no 5. HEMOPTYSIS: "Are you coughing up any blood?" If so ask: "How much?" (flecks, streaks, tablespoons, etc.)     no 6. TREATMENT: "What have you done so far to treat the cough?" (e.g., meds, fluids, humidifier)     Robitussin cough drops mucinex 7. CARDIAC HISTORY: "Do you have any history of heart disease?" (e.g., heart attack, congestive heart failure)      no 8. LUNG HISTORY: "Do you have any history of lung disease?"  (e.g., pulmonary embolus, asthma, emphysema)     no 9. PE RISK FACTORS: "Do you have a history of blood clots?" (or: recent major surgery, recent prolonged travel, bedridden)     no 10. OTHER SYMPTOMS: "Do you have any other symptoms? (e.g., runny nose, wheezing, chest pain)       no 11. PREGNANCY: "Is there any chance you are pregnant?" "When was your last menstrual period?"       N/A 12. TRAVEL: "Have you traveled out of the country in the last month?" (e.g., travel history, exposures)       no  Protocols used: COUGH - ACUTE NON-PRODUCTIVE-A-AH

## 2018-08-26 ENCOUNTER — Encounter: Payer: Self-pay | Admitting: Medical

## 2018-08-26 ENCOUNTER — Ambulatory Visit (INDEPENDENT_AMBULATORY_CARE_PROVIDER_SITE_OTHER): Payer: Medicare Other | Admitting: Medical

## 2018-08-26 ENCOUNTER — Ambulatory Visit (HOSPITAL_BASED_OUTPATIENT_CLINIC_OR_DEPARTMENT_OTHER)
Admission: RE | Admit: 2018-08-26 | Discharge: 2018-08-26 | Disposition: A | Discharge status: Home / Self Care | Payer: Medicare Other | Source: Ambulatory Visit | Attending: Medical | Admitting: Medical

## 2018-08-26 VITALS — BP 128/74 | HR 81 | Temp 98.2°F | Resp 16 | Ht 61.0 in | Wt 144.4 lb

## 2018-08-26 DIAGNOSIS — R059 Cough, unspecified: Secondary | ICD-10-CM

## 2018-08-26 DIAGNOSIS — R067 Sneezing: Secondary | ICD-10-CM

## 2018-08-26 DIAGNOSIS — R05 Cough: Secondary | ICD-10-CM

## 2018-08-26 MED ORDER — LEVOCETIRIZINE DIHYDROCHLORIDE 5 MG PO TABS: ORAL_TABLET | ORAL | 0 refills | Status: DC

## 2018-08-26 MED ORDER — BENZONATATE 100 MG PO CAPS: 100.0000 mg | ORAL_CAPSULE | Freq: Three times a day (TID) | ORAL | 0 refills | Status: DC | PRN

## 2018-08-26 NOTE — Patient Instructions (Signed)
For your intermittent cough that has lasted for almost 3 weeks, I do want you to get chest x-ray today.  Initial exam that you had upper respiratory infection but now questioning if there is possible allergy component as you do have some postnasal drainage on exam and report some occasional sneezing.  I prescribed benzonatate for cough and what she can use Xyzal antihistamine at night.  We will update you on x-ray results.  If you could update her son if your cough is improved we will go over x-ray results.  Follow-up in 7 to 10 days or as needed.

## 2018-08-26 NOTE — Progress Notes (Signed)
Subjective:    Patient ID: Kaitlyn Hanna, female    DOB: [birth year], 78 y.o.   MRN: [id number]  HPI  Pt in for nasal congestion, dry cough  and runny nose since early December. Pt saw Dr. Zigmund Daniel on Dec 13 th and he diagnosed her with uri. Pt just took robitussin otc.  Pt states she was getting bettering but never got rid of cough. No sinus pressure. Yesterday had some mild left ear pain.  No fever, no chills or sweats.  Pt states over last 3 days cough got worse and trouble sleeping.  Some sneezing on occasion.   Review of Systems  Constitutional: Negative for chills, fatigue and fever.  HENT: Positive for congestion and postnasal drip. Negative for sinus pressure and sinus pain.   Respiratory: Positive for cough. Negative for chest tightness, shortness of breath and wheezing.   Cardiovascular: Negative for chest pain and palpitations.  Gastrointestinal: Negative for abdominal pain, nausea and rectal pain.  Musculoskeletal: Negative for back pain and neck pain.  Skin: Negative for rash.  Neurological: Negative for dizziness and headaches.  Hematological: Negative for adenopathy. Does not bruise/bleed easily.  Psychiatric/Behavioral: Negative for behavioral problems and confusion. The patient is not nervous/anxious.    Past Medical History:  Diagnosis Date  . Benign schwannoma 06/30/2016  . H/O measles   . Hearing loss in right ear 04/14/2016  . Preventative health care 06/30/2016  . Raynaud's phenomenon 04/14/2016  . Schwannoma of cranial nerve ([employer]) 06/30/2016     Social History   Socioeconomic History  . Marital status: Married    Spouse name: Not on file  . Number of children: Not on file  . Years of education: Not on file  . Highest education level: Not on file  Occupational History  . Not on file  Social Needs  . Financial resource strain: Not on file  . Food insecurity:    Worry: Not on file    Inability: Not on file  . Transportation needs:    Medical: Not  on file    Non-medical: Not on file  Tobacco Use  . Smoking status: Never Smoker  . Smokeless tobacco: Never Used  Substance and Sexual Activity  . Alcohol use: No  . Drug use: No  . Sexual activity: Not on file  Lifestyle  . Physical activity:    Days per week: Not on file    Minutes per session: Not on file  . Stress: Not on file  Relationships  . Social connections:    Talks on phone: Not on file    Gets together: Not on file    Attends religious service: Not on file    Active member of club or organization: Not on file    Attends meetings of clubs or organizations: Not on file    Relationship status: Not on file  . Intimate partner violence:    Fear of current or ex partner: Not on file    Emotionally abused: Not on file    Physically abused: Not on file    Forced sexual activity: Not on file  Other Topics Concern  . 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 Walks and Y deep water exercise   Raised 2 stepkids, son and daughter    Past Surgical History:  Procedure Laterality Date  . ABDOMINAL HYSTERECTOMY  age 15 TAH b/l SPO for endometriosis  . TONSILLECTOMY    . WISDOM TOOTH EXTRACTION      Family History  Problem Relation Age of Onset  . Heart disease Mother        MI  . Cancer Father        lung cancer, small cell, smoker  . Hypertension Sister   . Dementia Sister   . Alcohol abuse Brother   . Cancer Maternal Grandmother   . Dementia Sister   . Arthritis Sister        Rheumtoid   . Alzheimer's disease Sister   . Cancer Brother     No Known Allergies  Current Outpatient Medications on File Prior to Visit  Medication Sig Dispense Refill  . Multiple Vitamins-Minerals (MULTIVITAMIN WITH MINERALS) tablet Take 1 tablet by mouth daily.    Marland Kitchen Specialty Vitamins Products (ONE-A-DAY BONE STRENGTH PO) Take by mouth 3 (three) times daily.       No current facility-administered medications on file prior to visit.     BP 128/74   Pulse 81   Temp 98.2 F (36.8 C) (Oral)   Resp 16   Ht 5\' 1"  (1.549 m)   Wt 144 lb 6.4 oz (65.5 kg)   LMP  (LMP Unknown)   SpO2 100%   BMI 27.28 kg/m       Objective:   Physical Exam  General  Mental Status - Alert. General Appearance - Well groomed. Not in acute distress.  Skin Rashes- No Rashes.  HEENT Head- Normal. Ear Auditory Canal - Left- Normal. Right - Normal.Tympanic Membrane- Left- Normal. Right- Normal. Eye Sclera/Conjunctiva- Left- Normal. Right- Normal. Nose & Sinuses Nasal Mucosa- Left-  Boggy and Congested. Right-  Boggy and  Congested.Bilateral no maxillary and no  frontal sinus pressure. Mouth & Throat Lips: Upper Lip- Normal: no dryness, cracking, pallor, cyanosis, or vesicular eruption. Lower Lip-Normal: no dryness, cracking, pallor, cyanosis or vesicular eruption. Buccal Mucosa- Bilateral- No Aphthous ulcers. Oropharynx- No Discharge or Erythema. +pnd. Tonsils: Characteristics- Bilateral- No Erythema or Congestion. Size/Enlargement- Bilateral- No enlargement. Discharge- bilateral-None.  Neck Neck- Supple. No Masses.   Chest and Lung Exam Auscultation: Breath Sounds:-Clear even and unlabored.  Cardiovascular Auscultation:Rythm- Regular, rate and rhythm. Murmurs & Other Heart Sounds:Ausculatation of the heart reveal- No Murmurs.  Lymphatic Head & Neck General Head & Neck Lymphatics: Bilateral: Description- No Localized lymphadenopathy.       Assessment & Plan:  For your intermittent cough that has lasted for almost 3 weeks, I do want you to get chest x-ray today.  Initial exam that you had upper respiratory infection but now questioning if there is possible allergy component as you do have some postnasal drainage on exam and report some occasional sneezing.  I prescribed benzonatate for cough and what she can use Xyzal antihistamine at night.  We  will update you on x-ray results.  If you could update her son if your cough is improved we will go over x-ray results.  Follow-up in 7 to 10 days or as needed.  Might consider zpack in near future. But no clear indication for antibiotic at this time.  Mackie Pai, PA-C

## 2018-08-29 ENCOUNTER — Encounter: Payer: Self-pay | Admitting: Family Medicine

## 2018-08-29 ENCOUNTER — Ambulatory Visit (INDEPENDENT_AMBULATORY_CARE_PROVIDER_SITE_OTHER): Payer: Medicare Other | Admitting: Family Medicine

## 2018-08-29 VITALS — BP 112/72 | HR 87 | Temp 99.2°F | Ht 61.0 in | Wt 142.5 lb

## 2018-08-29 DIAGNOSIS — J101 Influenza due to other identified influenza virus with other respiratory manifestations: Secondary | ICD-10-CM

## 2018-08-29 LAB — POCT INFLUENZA A/B
INFLUENZA A, POC: POSITIVE — AB
Influenza B, POC: NEGATIVE

## 2018-08-29 NOTE — Patient Instructions (Addendum)
Continue to push fluids, practice good hand hygiene, and cover your mouth if you cough.  If you start having fevers, shaking or shortness of breath, seek immediate care.  OK to take Tylenol 1000 mg (2 extra strength tabs) or 975 mg (3 regular strength tabs) every 6 hours as needed.  Tamiflu has not been shown to be helpful in someone overall healthy like you after the 48 hour period.  Let us know if you need anything.

## 2018-08-29 NOTE — Progress Notes (Signed)
Chief Complaint  Patient presents with  . Cough    Kaitlyn Hanna here for URI complaints.  Duration: 3 days  Associated symptoms: sinus congestion, low grade fever,  rhinorrhea, myalgia and cough Denies: sinus pain, itchy watery eyes, ear pain, ear drainage, sore throat, wheezing and shortness of breath Treatment to date: Cinnamon, honey Sick contacts: No  ROS:  Const: +low grade fevers HEENT: As noted in HPI Lungs: No SOB  Past Medical History:  Diagnosis Date  . Benign schwannoma 06/30/2016  . H/O measles   . Hearing loss in right ear 04/14/2016  . Preventative health care 06/30/2016  . Raynaud's phenomenon 04/14/2016  . Schwannoma of cranial nerve ([facility]) 06/30/2016    BP 112/72 (BP Location: Left Arm, Patient Position: Sitting, Cuff Size: Normal)   Pulse 87   Temp 99.2 F (37.3 C) (Oral)   Ht 5\' 1"  (1.549 m)   Wt 142 lb 8 oz (64.6 kg)   LMP  (LMP Unknown)   SpO2 95%   BMI 26.93 kg/m  General: Awake, alert, appears stated age HEENT: AT, [place], ears patent b/l and TM's neg, nares patent w/o discharge, pharynx pink and without exudates, MMM Neck: No masses or asymmetry Heart: RRR Lungs: CTAB, no accessory muscle use Psych: Age appropriate judgment and insight, normal mood and affect  Fever, unspecified fever cause - Plan: POCT Influenza A/B  Cough - Plan: POCT Influenza A/B  Flu test positive for A. Outside of 48 hr window. Supportive care. Tylenol.  Continue to push fluids, practice good hand hygiene, cover mouth when coughing. F/u prn. If starting to experience increasing fevers, shaking, or shortness of breath, seek immediate care. Pt voiced understanding and agreement to the plan.  McBride, DO 08/29/18 11:55 AM

## 2018-08-29 NOTE — Progress Notes (Signed)
Pre visit review using our clinic review tool, if applicable. No additional management support is needed unless otherwise documented below in the visit note. 

## 2018-09-02 NOTE — Progress Notes (Addendum)
Subjective:   Kaitlyn Hanna is a 77 y.o. female who presents for Medicare Annual (Subsequent) preventive examination.  Sews for a charity.  Review of Systems: No ROS.  Medicare Wellness Visit. Additional risk factors are reflected in the social history. Cardiac Risk Factors include: advanced age (>48men, >45 women);dyslipidemia Sleep patterns: no issues Home Safety/Smoke Alarms: Feels safe in home. Smoke alarms in place.  Lives with husband in 1 story home.  Walk-in Shower w/ grab rail.  Female:         Mammo-  Dr.Henley 01/22/2018 normal per pt.     Dexa scan- 08/25/17        CCS- Will discuss in Feb with PCP.  Eye- [facility] the [facility] center.     Objective:     Vitals: BP 122/74 (BP Location: Left Arm, Patient Position: Sitting, Cuff Size: Normal)   Pulse 79   Ht 5\' 1"  (1.549 m)   Wt 141 lb 6.4 oz (64.1 kg)   LMP  (LMP Unknown)   SpO2 97%   BMI 26.72 kg/m   Body mass index is 26.72 kg/m.  Advanced Directives 09/05/2018  Does Patient Have a Medical Advance Directive? Yes  Type of Paramedic of [place];Living will  Does patient want to make changes to medical advance directive? No - Patient declined  Copy of [place] in Chart? No - copy requested    Tobacco Social History   Tobacco Use  Smoking Status Never Smoker  Smokeless Tobacco Never Used     Counseling given: Not Answered   Clinical Intake: Pain : No/denies pain   Past Medical History:  Diagnosis Date  . Benign schwannoma 06/30/2016  . H/O measles   . Hearing loss in right ear 04/14/2016  . Raynaud's phenomenon 04/14/2016  . Schwannoma of cranial nerve ([place]) 06/30/2016   Past Surgical History:  Procedure Laterality Date  . ABDOMINAL HYSTERECTOMY     age 54 TAH b/l SPO for endometriosis  . TONSILLECTOMY    . WISDOM TOOTH EXTRACTION     Family History  Problem Relation Age of Onset  . Heart disease Mother        MI  . Cancer Father        lung  cancer, small cell, smoker  . Hypertension Sister   . Dementia Sister   . Alcohol abuse Brother   . Cancer Maternal Grandmother   . Dementia Sister   . Arthritis Sister        Rheumtoid   . Alzheimer's disease Sister   . Cancer Brother    Social History   Socioeconomic History  . Marital status: Married    Spouse name: Not on file  . Number of children: Not on file  . Years of education: Not on file  . Highest education level: Not on file  Occupational History  . Not on file  Social Needs  . Financial resource strain: Not on file  . Food insecurity:    Worry: Not on file    Inability: Not on file  . Transportation needs:    Medical: Not on file    Non-medical: Not on file  Tobacco Use  . Smoking status: Never Smoker  . Smokeless tobacco: Never Used  Substance and Sexual Activity  . Alcohol use: No  . Drug use: No  . Sexual activity: Yes  Lifestyle  . Physical activity:    Days per week: Not on file    Minutes per  session: Not on file  . Stress: Not on file  Relationships  . Social connections:    Talks on phone: Not on file    Gets together: Not on file    Attends religious service: Not on file    Active member of club or organization: Not on file    Attends meetings of clubs or organizations: Not on file    Relationship status: Not on file  Other Topics Concern  . 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 Walks and Y deep water exercise   Raised 2 stepkids, son and daughter    Outpatient Encounter Medications as of 09/05/2018  Medication Sig  . benzonatate (TESSALON) 100 MG capsule Take 1 capsule (100 mg total) by mouth 3 (three) times daily as needed for cough.  . Multiple Vitamins-Minerals (MULTIVITAMIN WITH MINERALS) tablet Take 1 tablet by mouth daily.  Marland Kitchen Specialty Vitamins Products (ONE-A-DAY BONE STRENGTH PO) Take by mouth 3 (three)  times daily.  Marland Kitchen levocetirizine (XYZAL) 5 MG tablet 1 tab po q hs (Patient not taking: Reported on 09/05/2018)   No facility-administered encounter medications on file as of 09/05/2018.     Activities of Daily Living In your present state of health, do you have any difficulty performing the following activities: 09/05/2018  Hearing? N  Comment Sees Dr.Teoh yearly.  Vision? N  Comment wearing glasses  Difficulty concentrating or making decisions? N  Walking or climbing stairs? N  Dressing or bathing? N  Doing errands, shopping? N  Preparing Food and eating ? N  Using the Toilet? N  In the past six months, have you accidently leaked urine? N  Do you have problems with loss of bowel control? N  Managing your Medications? N  Managing your Finances? N  Housekeeping or managing your Housekeeping? N  Some recent data might be hidden    Patient Care Team: [name], MD as PCP - General (Family Medicine)    Assessment:   This is a routine wellness examination for Kaitlyn Hanna. Physical assessment deferred to PCP.  Exercise Activities and Dietary recommendations Current Exercise Habits: The patient does not participate in regular exercise at present, Exercise limited by: None identified   Diet (meal preparation, eat out, water intake, caffeinated beverages, dairy products, fruits and vegetables): well balanced, on average, 3 meals per day      Goals    . Increase physical activity       Fall Risk Fall Risk  09/05/2018 04/14/2016  Falls in the past year? 0 No   Depression Screen PHQ 2/9 Scores 09/05/2018 04/14/2016  PHQ - 2 Score 0 0     Cognitive Function Ad8 score reviewed for issues:  Issues making decisions:no  Less interest in hobbies / activities:no  Repeats questions, stories (family complaining):no  Trouble using ordinary gadgets (microwave, computer, phone):no  Forgets the month or year: no  oMismanaging finances: no  Remembering appts:no  Daily problems with  thinking and/or memory:no Ad8 score is=0        Immunization History  Administered Date(s) Administered  . Influenza, High Dose Seasonal PF 05/29/2017  . Influenza-Unspecified 06/22/2016   Screening Tests Health Maintenance  Topic Date Due  . TETANUS/TDAP  01/09/1962  . PNA vac Low Risk Adult (1 of 2 - PCV13) 01/10/2008  . COLONOSCOPY  08/01/2018  . INFLUENZA VACCINE  Completed  .  DEXA SCAN  Completed       Plan:    Please schedule your next medicare wellness visit with me in 1 yr.  Continue to eat heart healthy diet (full of fruits, vegetables, whole grains, lean protein, water--limit salt, fat, and sugar intake) and increase physical activity as tolerated.  Continue doing brain stimulating activities (puzzles, reading, adult coloring books, staying active) to keep memory sharp.   Bring a copy of your living will and/or healthcare power of attorney to your next office visit.    I have personally reviewed and noted the following in the patient's chart:   . Medical and social history . Use of alcohol, tobacco or illicit drugs  . Current medications and supplements . Functional ability and status . Nutritional status . Physical activity . Advanced directives . List of other physicians . Hospitalizations, surgeries, and ER visits in previous 12 months . Vitals . Screenings to include cognitive, depression, and falls . Referrals and appointments  In addition, I have reviewed and discussed with patient certain preventive protocols, quality metrics, and best practice recommendations. A written personalized care plan for preventive services as well as general preventive health recommendations were provided to patient.     Shela Nevin, [place]  09/05/2018   Medical screening examination/treatment was performed by qualified clinical staff member and as supervising physician I was immediately available for consultation/collaboration. I have reviewed documentation and  agree with assessment and plan.  Penni Homans, MD

## 2018-09-05 ENCOUNTER — Ambulatory Visit (INDEPENDENT_AMBULATORY_CARE_PROVIDER_SITE_OTHER): Payer: Medicare Other | Admitting: *Deleted

## 2018-09-05 ENCOUNTER — Encounter: Payer: Self-pay | Admitting: *Deleted

## 2018-09-05 VITALS — BP 122/74 | HR 79 | Ht 61.0 in | Wt 141.4 lb

## 2018-09-05 DIAGNOSIS — Z0000 Encounter for general adult medical examination without abnormal findings: Secondary | ICD-10-CM | POA: Diagnosis not present

## 2018-09-05 NOTE — Patient Instructions (Addendum)
Please schedule your next medicare wellness visit with me in 1 yr.  Continue to eat heart healthy diet (full of fruits, vegetables, whole grains, lean protein, water--limit salt, fat, and sugar intake) and increase physical activity as tolerated.  Continue doing brain stimulating activities (puzzles, reading, adult coloring books, staying active) to keep memory sharp.   Bring a copy of your living will and/or healthcare power of attorney to your next office visit.    [name] , Thank you for taking time to come for your Medicare Wellness Visit. I appreciate your ongoing commitment to your health goals. Please review the following plan we discussed and let me know if I can assist you in the future.   These are the goals we discussed: Goals    . Increase physical activity       This is a list of the screening recommended for you and due dates:  Health Maintenance  Topic Date Due  . Tetanus Vaccine  01/09/1962  . Pneumonia vaccines (1 of 2 - PCV13) 01/10/2008  . Colon Cancer Screening  08/01/2018  . Flu Shot  Completed  . DEXA scan (bone density measurement)  Completed    Health Maintenance After Age 53 After age 84, you are at a higher risk for certain long-term diseases and infections as well as injuries from falls. Falls are a major cause of broken bones and head injuries in people who are older than age 7. Getting regular preventive care can help to keep you healthy and well. Preventive care includes getting regular testing and making lifestyle changes as recommended by your health care provider. Talk with your health care provider about:  Which screenings and tests you should have. A screening is a test that checks for a disease when you have no symptoms.  A diet and exercise plan that is right for you. What should I know about screenings and tests to prevent falls? Screening and testing are the best ways to find a health problem early. Early diagnosis and treatment give you  the best chance of managing medical conditions that are common after age 68. Certain conditions and lifestyle choices may make you more likely to have a fall. Your health care provider may recommend:  Regular vision checks. Poor vision and conditions such as cataracts can make you more likely to have a fall. If you wear glasses, make sure to get your prescription updated if your vision changes.  Medicine review. Work with your health care provider to regularly review all of the medicines you are taking, including over-the-counter medicines. Ask your health care provider about any side effects that may make you more likely to have a fall. Tell your health care provider if any medicines that you take make you feel dizzy or sleepy.  Osteoporosis screening. Osteoporosis is a condition that causes the bones to get weaker. This can make the bones weak and cause them to break more easily.  Blood pressure screening. Blood pressure changes and medicines to control blood pressure can make you feel dizzy.  Strength and balance checks. Your health care provider may recommend certain tests to check your strength and balance while standing, walking, or changing positions.  Foot health exam. Foot pain and numbness, as well as not wearing proper footwear, can make you more likely to have a fall.  Depression screening. You may be more likely to have a fall if you have a fear of falling, feel emotionally low, or feel unable to do activities that you  used to do.  Alcohol use screening. Using too much alcohol can affect your balance and may make you more likely to have a fall. What actions can I take to lower my risk of falls? General instructions  Talk with your health care provider about your risks for falling. Tell your health care provider if: ? You fall. Be sure to tell your health care provider about all falls, even ones that seem minor. ? You feel dizzy, sleepy, or off-balance.  Take over-the-counter and  prescription medicines only as told by your health care provider. These include any supplements.  Eat a healthy diet and maintain a healthy weight. A healthy diet includes low-fat dairy products, low-fat (lean) meats, and fiber from whole grains, beans, and lots of fruits and vegetables. Home safety  Remove any tripping hazards, such as rugs, cords, and clutter.  Install safety equipment such as grab bars in bathrooms and safety rails on stairs.  Keep rooms and walkways well-lit. Activity   Follow a regular exercise program to stay fit. This will help you maintain your balance. Ask your health care provider what types of exercise are appropriate for you.  If you need a cane or walker, use it as recommended by your health care provider.  Wear supportive shoes that have nonskid soles. Lifestyle  Do not drink alcohol if your health care provider tells you not to drink.  If you drink alcohol, limit how much you have: ? 0-1 drink a day for women. ? 0-2 drinks a day for men.  Be aware of how much alcohol is in your drink. In the [place] one drink equals one typical bottle of beer (12 oz), one-half glass of wine (5 oz), or one shot of hard liquor (1 oz).  Do not use any products that contain nicotine or tobacco, such as cigarettes and e-cigarettes. If you need help quitting, ask your health care provider. Summary  Having a healthy lifestyle and getting preventive care can help to protect your health and wellness after age 29.  Screening and testing are the best way to find a health problem early and help you avoid having a fall. Early diagnosis and treatment give you the best chance for managing medical conditions that are more common for people who are older than age 36.  Falls are a major cause of broken bones and head injuries in people who are older than age 64. Take precautions to prevent a fall at home.  Work with your health care provider to learn what changes you can make to  improve your health and wellness and to prevent falls. This information is not intended to replace advice given to you by your health care provider. Make sure you discuss any questions you have with your health care provider. Document Released: 06/23/2017 Document Revised: 06/23/2017 Document Reviewed: 06/23/2017 Elsevier Interactive Patient Education  2019 [employer].

## 2018-09-27 ENCOUNTER — Encounter: Payer: Medicare Other | Admitting: Family Medicine

## 2018-09-30 ENCOUNTER — Encounter: Payer: Self-pay | Admitting: Family Medicine

## 2018-09-30 ENCOUNTER — Ambulatory Visit (INDEPENDENT_AMBULATORY_CARE_PROVIDER_SITE_OTHER): Payer: Medicare Other | Admitting: Family Medicine

## 2018-09-30 VITALS — BP 118/62 | HR 62 | Temp 98.1°F | Resp 16 | Ht 60.6 in | Wt 143.2 lb

## 2018-09-30 DIAGNOSIS — Z0000 Encounter for general adult medical examination without abnormal findings: Secondary | ICD-10-CM | POA: Diagnosis not present

## 2018-09-30 DIAGNOSIS — L578 Other skin changes due to chronic exposure to nonionizing radiation: Secondary | ICD-10-CM

## 2018-09-30 DIAGNOSIS — E782 Mixed hyperlipidemia: Secondary | ICD-10-CM

## 2018-09-30 DIAGNOSIS — J069 Acute upper respiratory infection, unspecified: Secondary | ICD-10-CM | POA: Diagnosis not present

## 2018-09-30 DIAGNOSIS — M810 Age-related osteoporosis without current pathological fracture: Secondary | ICD-10-CM

## 2018-09-30 LAB — CBC
HCT: 38 % (ref 36.0–46.0)
Hemoglobin: 12.6 g/dL (ref 12.0–15.0)
MCHC: 33.1 g/dL (ref 30.0–36.0)
MCV: 87.8 fl (ref 78.0–100.0)
PLATELETS: 246 10*3/uL (ref 150.0–400.0)
RBC: 4.33 Mil/uL (ref 3.87–5.11)
RDW: 14.3 % (ref 11.5–15.5)
WBC: 6.4 10*3/uL (ref 4.0–10.5)

## 2018-09-30 LAB — LIPID PANEL
Cholesterol: 224 mg/dL — ABNORMAL HIGH (ref 0–200)
HDL: 44.4 mg/dL (ref >39.00)
LDL Cholesterol: 151 mg/dL — ABNORMAL HIGH (ref 0–99)
NonHDL: 179.43
Total CHOL/HDL Ratio: 5
Triglycerides: 141 mg/dL (ref 0.0–149.0)
VLDL: 28.2 mg/dL (ref 0.0–40.0)

## 2018-09-30 LAB — COMPREHENSIVE METABOLIC PANEL
ALT: 11 U/L (ref 0–35)
AST: 16 U/L (ref 0–37)
Albumin: 4.2 g/dL (ref 3.5–5.2)
Alkaline Phosphatase: 88 U/L (ref 39–117)
BUN: 15 mg/dL (ref 6–23)
CALCIUM: 9 mg/dL (ref 8.4–10.5)
CO2: 29 mEq/L (ref 19–32)
CREATININE: 0.86 mg/dL (ref 0.40–1.20)
Chloride: 101 mEq/L (ref 96–112)
GFR: 64.2 mL/min (ref >60.00)
Glucose, Bld: 80 mg/dL (ref 70–99)
Potassium: 4.3 mEq/L (ref 3.5–5.1)
Sodium: 138 mEq/L (ref 135–145)
Total Bilirubin: 0.4 mg/dL (ref 0.2–1.2)
Total Protein: 7.4 g/dL (ref 6.0–8.3)

## 2018-09-30 LAB — TSH: TSH: 4.32 u[IU]/mL (ref 0.35–4.50)

## 2018-09-30 LAB — VITAMIN D 25 HYDROXY (VIT D DEFICIENCY, FRACTURES): VITD: 31.35 ng/mL (ref 30.00–100.00)

## 2018-09-30 NOTE — Patient Instructions (Addendum)
Shingrix is the new shingles shot, 2 shots over 2-6 months at the pharmacy   Recommend calcium intake of 1200 to 1500 mg daily, divided into roughly 3 doses. Best source is the diet and a single dairy serving is about 500 mg, a supplement of calcium citrate once or twice daily to balance diet is fine if not getting enough in diet. Also need Vitamin D 2000 IU caps, 1 cap daily if not already taking vitamin D. Also recommend weight baring exercise on hips and upper body to keep bones strong  Preventive Care 65 Years and Older, Female Preventive care refers to lifestyle choices and visits with your health care provider that can promote health and wellness. What does preventive care include?  A yearly physical exam. This is also called an annual well check.  Dental exams once or twice a year.  Routine eye exams. Ask your health care provider how often you should have your eyes checked.  Personal lifestyle choices, including: ? Daily care of your teeth and gums. ? Regular physical activity. ? Eating a healthy diet. ? Avoiding tobacco and drug use. ? Limiting alcohol use. ? Practicing safe sex. ? Taking low-dose aspirin every day. ? Taking vitamin and mineral supplements as recommended by your health care provider. What happens during an annual well check? The services and screenings done by your health care provider during your annual well check will depend on your age, overall health, lifestyle risk factors, and family history of disease. Counseling Your health care provider may ask you questions about your:  Alcohol use.  Tobacco use.  Drug use.  Emotional well-being.  Home and relationship well-being.  Sexual activity.  Eating habits.  History of falls.  Memory and ability to understand (cognition).  Work and work Statistician.  Reproductive health.  Screening You may have the following tests or measurements:  Height, weight, and BMI.  Blood pressure.  Lipid and  cholesterol levels. These may be checked every 5 years, or more frequently if you are over 52 years old.  Skin check.  Lung cancer screening. You may have this screening every year starting at age 76 if you have a 30-pack-year history of smoking and currently smoke or have quit within the past 15 years.  Colorectal cancer screening. All adults should have this screening starting at age 20 and continuing until age 43. You will have tests every 1-10 years, depending on your results and the type of screening test. People at increased risk should start screening at an earlier age. Screening tests may include: ? Guaiac-based fecal occult blood testing. ? Fecal immunochemical test (FIT). ? Stool DNA test. ? Virtual colonoscopy. ? Sigmoidoscopy. During this test, a flexible tube with a tiny camera (sigmoidoscope) is used to examine your rectum and lower colon. The sigmoidoscope is inserted through your anus into your rectum and lower colon. ? Colonoscopy. During this test, a long, thin, flexible tube with a tiny camera (colonoscope) is used to examine your entire colon and rectum.  Hepatitis C blood test.  Hepatitis B blood test.  Sexually transmitted disease (STD) testing.  Diabetes screening. This is done by checking your blood sugar (glucose) after you have not eaten for a while (fasting). You may have this done every 1-3 years.  Bone density scan. This is done to screen for osteoporosis. You may have this done starting at age 106.  Mammogram. This may be done every 1-2 years. Talk to your health care provider about how often you should  have regular mammograms. Talk with your health care provider about your test results, treatment options, and if necessary, the need for more tests. Vaccines Your health care provider may recommend certain vaccines, such as:  Influenza vaccine. This is recommended every year.  Tetanus, diphtheria, and acellular pertussis (Tdap, Td) vaccine. You may need a Td  booster every 10 years.  Varicella vaccine. You may need this if you have not been vaccinated.  Zoster vaccine. You may need this after age 80.  Measles, mumps, and rubella (MMR) vaccine. You may need at least one dose of MMR if you were born in 1957 or later. You may also need a second dose.  Pneumococcal 13-valent conjugate (PCV13) vaccine. One dose is recommended after age 62.  Pneumococcal polysaccharide (PPSV23) vaccine. One dose is recommended after age 52.  Meningococcal vaccine. You may need this if you have certain conditions.  Hepatitis A vaccine. You may need this if you have certain conditions or if you travel or work in places where you may be exposed to hepatitis A.  Hepatitis B vaccine. You may need this if you have certain conditions or if you travel or work in places where you may be exposed to hepatitis B.  Haemophilus influenzae type b (Hib) vaccine. You may need this if you have certain conditions. Talk to your health care provider about which screenings and vaccines you need and how often you need them. This information is not intended to replace advice given to you by your health care provider. Make sure you discuss any questions you have with your health care provider. Document Released: 09/06/2015 Document Revised: 09/30/2017 Document Reviewed: 06/11/2015 Elsevier Interactive Patient Education  2019 [employer].

## 2018-09-30 NOTE — Assessment & Plan Note (Signed)
Patient encouraged to maintain heart healthy diet, regular exercise, adequate sleep. Consider daily probiotics. Take medications as prescribed 

## 2018-09-30 NOTE — Assessment & Plan Note (Signed)
Encouraged to get adequate exercise, calcium and vitamin d intake 

## 2018-09-30 NOTE — Assessment & Plan Note (Addendum)
Recently struggled with Influenza A, she feels well today. Encouraged increased rest and hydration, add probiotics, zinc such as Coldeze or Xicam. Treat fevers as needed, if symptoms worsen

## 2018-09-30 NOTE — Assessment & Plan Note (Signed)
Encouraged heart healthy diet, increase exercise, avoid trans fats, consider a krill oil cap daily 

## 2018-10-02 DIAGNOSIS — L578 Other skin changes due to chronic exposure to nonionizing radiation: Secondary | ICD-10-CM | POA: Insufficient documentation

## 2018-10-02 NOTE — Progress Notes (Signed)
Subjective:    Patient ID: Kaitlyn Hanna, female    DOB: 10-25-1940, 78 y.o.   MRN: [id number]  Chief Complaint  Patient presents with  . Annual Exam    HPI Patient is in today for annual preventative exam and follow up on chronic medical concerns including hyperlipidemia and osteoporosis. She has noted recent increased respiratory illness but notes her symptoms are largely resolved at this time. No other recent illness or hospitalizations. Denies CP/palp/SOB/HA/congestion/fevers/GI or GU c/o. Taking meds as prescribed. She is doing well with her activities of daily living and is trying to maintain a heart healthy diet. Stays active.  Past Medical History:  Diagnosis Date  . Benign schwannoma 06/30/2016  . H/O measles   . Hearing loss in right ear 04/14/2016  . Osteoporosis 04/14/2016  . Raynaud's phenomenon 04/14/2016  . Schwannoma of cranial nerve ([place]) 06/30/2016    Past Surgical History:  Procedure Laterality Date  . ABDOMINAL HYSTERECTOMY     age 82 TAH b/l SPO for endometriosis  . TONSILLECTOMY    . WISDOM TOOTH EXTRACTION      Family History  Problem Relation Age of Onset  . Heart disease Mother        MI  . Cancer Father        lung cancer, small cell, smoker  . Hypertension Sister   . Dementia Sister   . Alcohol abuse Brother   . Cancer Maternal Grandmother   . Dementia Sister   . Arthritis Sister        Rheumtoid   . Alzheimer's disease Sister   . Cancer Brother     Social History   Socioeconomic History  . Marital status: Married    Spouse name: Not on file  . Number of children: Not on file  . Years of education: Not on file  . Highest education level: Not on file  Occupational History  . Not on file  Social Needs  . Financial resource strain: Not on file  . Food insecurity:    Worry: Not on file    Inability: Not on file  . Transportation needs:    Medical: Not on file    Non-medical: Not on file  Tobacco Use  . Smoking status: Never Smoker    . Smokeless tobacco: Never Used  Substance and Sexual Activity  . Alcohol use: No  . Drug use: No  . Sexual activity: Yes  Lifestyle  . Physical activity:    Days per week: Not on file    Minutes per session: Not on file  . Stress: Not on file  Relationships  . Social connections:    Talks on phone: Not on file    Gets together: Not on file    Attends religious service: Not on file    Active member of club or organization: Not on file    Attends meetings of clubs or organizations: Not on file    Relationship status: Not on file  . Intimate partner violence:    Fear of current or ex partner: Not on file    Emotionally abused: Not on file    Physically abused: Not on file    Forced sexual activity: Not on file  Other Topics Concern  . 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 Walks and Y deep water exercise   Raised 2  stepkids, son and daughter    Outpatient Medications Prior to Visit  Medication Sig Dispense Refill  . Multiple Vitamins-Minerals (MULTIVITAMIN WITH MINERALS) tablet Take 1 tablet by mouth daily.    Marland Kitchen Specialty Vitamins Products (ONE-A-DAY BONE STRENGTH PO) Take by mouth 3 (three) times daily.    . benzonatate (TESSALON) 100 MG capsule Take 1 capsule (100 mg total) by mouth 3 (three) times daily as needed for cough. 30 capsule 0  . levocetirizine (XYZAL) 5 MG tablet 1 tab po q hs (Patient not taking: Reported on 09/05/2018) 30 tablet 0   No facility-administered medications prior to visit.     No Known Allergies  Review of Systems  Constitutional: Negative for chills, fever and malaise/fatigue.  HENT: Negative for congestion and hearing loss.   Eyes: Negative for discharge.  Respiratory: Negative for cough, sputum production and shortness of breath.   Cardiovascular: Negative for chest pain, palpitations and leg swelling.  Gastrointestinal:  Negative for abdominal pain, blood in stool, constipation, diarrhea, heartburn, nausea and vomiting.  Genitourinary: Negative for dysuria, frequency, hematuria and urgency.  Musculoskeletal: Negative for back pain, falls and myalgias.  Skin: Negative for rash.  Neurological: Negative for dizziness, sensory change, loss of consciousness, weakness and headaches.  Endo/Heme/Allergies: Negative for environmental allergies. Does not bruise/bleed easily.  Psychiatric/Behavioral: Negative for depression and suicidal ideas. The patient is not nervous/anxious and does not have insomnia.        Objective:    Physical Exam Constitutional:      General: She is not in acute distress.    Appearance: She is well-developed.  HENT:     Head: Normocephalic and atraumatic.  Eyes:     Conjunctiva/sclera: Conjunctivae normal.  Neck:     Musculoskeletal: Neck supple.     Thyroid: No thyromegaly.  Cardiovascular:     Rate and Rhythm: Normal rate and regular rhythm.     Heart sounds: Normal heart sounds. No murmur.  Pulmonary:     Effort: Pulmonary effort is normal. No respiratory distress.     Breath sounds: Normal breath sounds.  Abdominal:     General: Bowel sounds are normal. There is no distension.     Palpations: Abdomen is soft. There is no mass.     Tenderness: There is no abdominal tenderness.  Lymphadenopathy:     Cervical: No cervical adenopathy.  Skin:    General: Skin is warm and dry.  Neurological:     Mental Status: She is alert and oriented to person, place, and time.  Psychiatric:        Behavior: Behavior normal.     BP 118/62 (BP Location: Left Arm, Cuff Size: Normal)   Pulse 62   Temp 98.1 F (36.7 C) (Oral)   Resp 16   Ht 5' 0.6" (1.539 m)   Wt 143 lb 3.2 oz (65 kg)   LMP  (LMP Unknown)   SpO2 94%   BMI 27.42 kg/m  Wt Readings from Last 3 Encounters:  09/30/18 143 lb 3.2 oz (65 kg)  09/05/18 141 lb 6.4 oz (64.1 kg)  08/29/18 142 lb 8 oz (64.6 kg)     Lab  Results  Component Value Date   WBC 6.4 09/30/2018   HGB 12.6 09/30/2018   HCT 38.0 09/30/2018   PLT 246.0 09/30/2018   GLUCOSE 80 09/30/2018   CHOL 224 (H) 09/30/2018   TRIG 141.0 09/30/2018   HDL 44.40 09/30/2018   LDLCALC 151 (H) 09/30/2018   ALT 11 09/30/2018  AST 16 09/30/2018   NA 138 09/30/2018   K 4.3 09/30/2018   CL 101 09/30/2018   CREATININE 0.86 09/30/2018   BUN 15 09/30/2018   CO2 29 09/30/2018   TSH 4.32 09/30/2018    Lab Results  Component Value Date   TSH 4.32 09/30/2018   Lab Results  Component Value Date   WBC 6.4 09/30/2018   HGB 12.6 09/30/2018   HCT 38.0 09/30/2018   MCV 87.8 09/30/2018   PLT 246.0 09/30/2018   Lab Results  Component Value Date   NA 138 09/30/2018   K 4.3 09/30/2018   CO2 29 09/30/2018   GLUCOSE 80 09/30/2018   BUN 15 09/30/2018   CREATININE 0.86 09/30/2018   BILITOT 0.4 09/30/2018   ALKPHOS 88 09/30/2018   AST 16 09/30/2018   ALT 11 09/30/2018   PROT 7.4 09/30/2018   ALBUMIN 4.2 09/30/2018   CALCIUM 9.0 09/30/2018   GFR 64.20 09/30/2018   Lab Results  Component Value Date   CHOL 224 (H) 09/30/2018   Lab Results  Component Value Date   HDL 44.40 09/30/2018   Lab Results  Component Value Date   LDLCALC 151 (H) 09/30/2018   Lab Results  Component Value Date   TRIG 141.0 09/30/2018   Lab Results  Component Value Date   CHOLHDL 5 09/30/2018   No results found for: HGBA1C     Assessment & Plan:   Problem List Items Addressed This Visit    Hyperlipidemia, mixed    Encouraged heart healthy diet, increase exercise, avoid trans fats, consider a krill oil cap daily      Relevant Orders   Comprehensive metabolic panel (Completed)   Lipid panel (Completed)   TSH (Completed)   Osteoporosis    Encouraged to get adequate exercise, calcium and vitamin d intake      Relevant Orders   Vitamin D (25 hydroxy) (Completed)   Preventative health care    .Patient encouraged to maintain heart healthy diet,  regular exercise, adequate sleep. Consider daily probiotics. Take medications as prescribed      Relevant Orders   CBC (Completed)   Comprehensive metabolic panel (Completed)   TSH (Completed)   URI (upper respiratory infection)    Recently struggled with Influenza A, she feels well today. Encouraged increased rest and hydration, add probiotics, zinc such as Coldeze or Xicam. Treat fevers as needed, if symptoms worsen      Relevant Orders   CBC (Completed)   Sun-damaged skin - Primary    Referred to dermatology for further consideration      Relevant Orders   Ambulatory referral to Dermatology      I have discontinued Dwan Bolt. Hechavarria's benzonatate and levocetirizine. I am also having her maintain her multivitamin with minerals and Specialty Vitamins Products (ONE-A-DAY BONE STRENGTH PO).  No orders of the defined types were placed in this encounter.    Penni Homans, MD

## 2018-10-02 NOTE — Assessment & Plan Note (Signed)
Referred to dermatology for further consideration.  

## 2018-10-06 ENCOUNTER — Telehealth: Payer: Self-pay | Admitting: *Deleted

## 2018-10-06 NOTE — Telephone Encounter (Signed)
Received Mammogram results from [facility]; forwarded to provider/SLS 02/13

## 2018-10-14 ENCOUNTER — Encounter: Payer: Self-pay | Admitting: Family Medicine

## 2018-11-08 DIAGNOSIS — H04123 Dry eye syndrome of bilateral lacrimal glands: Secondary | ICD-10-CM | POA: Diagnosis not present

## 2018-11-08 DIAGNOSIS — H43813 Vitreous degeneration, bilateral: Secondary | ICD-10-CM | POA: Diagnosis not present

## 2019-01-26 ENCOUNTER — Other Ambulatory Visit: Payer: Self-pay | Admitting: Otolaryngology

## 2019-01-26 DIAGNOSIS — D333 Benign neoplasm of cranial nerves: Secondary | ICD-10-CM

## 2019-02-07 DIAGNOSIS — L821 Other seborrheic keratosis: Secondary | ICD-10-CM | POA: Diagnosis not present

## 2019-02-13 ENCOUNTER — Other Ambulatory Visit: Payer: Medicare Other

## 2019-03-01 DIAGNOSIS — Z1231 Encounter for screening mammogram for malignant neoplasm of breast: Secondary | ICD-10-CM | POA: Diagnosis not present

## 2019-03-20 ENCOUNTER — Ambulatory Visit
Admission: RE | Admit: 2019-03-20 | Discharge: 2019-03-20 | Disposition: A | Discharge status: Home / Self Care | Payer: Medicare Other | Source: Ambulatory Visit | Attending: Otolaryngology | Admitting: Otolaryngology

## 2019-03-20 DIAGNOSIS — D333 Benign neoplasm of cranial nerves: Secondary | ICD-10-CM

## 2019-03-20 MED ORDER — GADOBENATE DIMEGLUMINE 529 MG/ML IV SOLN
14.0000 mL | Freq: Once | INTRAVENOUS | Status: AC | PRN
  Administered 2019-03-20: 14 mL via INTRAVENOUS

## 2019-04-04 DIAGNOSIS — H903 Sensorineural hearing loss, bilateral: Secondary | ICD-10-CM | POA: Diagnosis not present

## 2019-04-04 DIAGNOSIS — D333 Benign neoplasm of cranial nerves: Secondary | ICD-10-CM | POA: Diagnosis not present

## 2019-05-04 ENCOUNTER — Telehealth: Payer: Self-pay | Admitting: General Practice

## 2019-05-04 ENCOUNTER — Other Ambulatory Visit: Payer: Self-pay | Admitting: Family Medicine

## 2019-05-04 DIAGNOSIS — H9193 Unspecified hearing loss, bilateral: Secondary | ICD-10-CM

## 2019-05-04 NOTE — Telephone Encounter (Signed)
I have ordered the referral

## 2019-05-04 NOTE — Telephone Encounter (Signed)
Called and left a detailed message to advise pt that referral was placed.

## 2019-05-04 NOTE — Telephone Encounter (Signed)
Ok for referral?  Copied from [place]. Topic: Referral - Request for Referral >> Apr 18, 2019  2:31 PM Reyne Dumas L wrote: Has patient seen PCP for this complaint? yes *If NO, is insurance requiring patient see PCP for this issue before PCP can refer them? Referral for which specialty: audiologist Preferred provider/office: Dr. Lonzo Candy, phone number: [phone] Reason for referral: trouble hearing - needs hearing aid

## 2019-09-07 NOTE — Progress Notes (Signed)
Virtual Visit via Audio Note  I connected with patient on 09/08/19 at  2:00 PM EST by audio enabled telemedicine application and verified that I am speaking with the correct person using two identifiers.   THIS ENCOUNTER IS A VIRTUAL VISIT DUE TO COVID-19 - PATIENT WAS NOT SEEN IN THE OFFICE. PATIENT HAS CONSENTED TO VIRTUAL VISIT / TELEMEDICINE VISIT   Location of patient: home  Location of provider: office  I discussed the limitations of evaluation and management by telemedicine and the availability of in person appointments. The patient expressed understanding and agreed to proceed.   Subjective:   Kaitlyn Hanna is a 79 y.o. female who presents for Medicare Annual (Subsequent) preventive examination.  Review of Systems: Home Safety/Smoke Alarms: Feels safe in home. Smoke alarms in place.  Lives w/ husband in 1 story home. Walk-in shower w/ grab bar.    Female:       Mammo- pt reports done w/ Dr.Henley 03/13/19    Dexa scan-ordered  CCS-declines    Objective:     Vitals: Unable to assess. This visit is enabled though telemedicine due to Covid 19.   Advanced Directives 09/08/2019 09/05/2018  Does Patient Have a Medical Advance Directive? Yes Yes  Type of Advance Directive (No Data) [place];Living will  Does patient want to make changes to medical advance directive? No - Patient declined No - Patient declined  Copy of [place] in Chart? - No - copy requested    Tobacco Social History   Tobacco Use  Smoking Status Never Smoker  Smokeless Tobacco Never Used     Counseling given: Not Answered   Clinical Intake: Pain : No/denies pain     Past Medical History:  Diagnosis Date  . Benign schwannoma 06/30/2016  . H/O measles   . Hearing loss in right ear 04/14/2016  . Osteoporosis 04/14/2016  . Raynaud's phenomenon 04/14/2016  . Schwannoma of cranial nerve ([place]) 06/30/2016   Past Surgical History:  Procedure Laterality Date  .  ABDOMINAL HYSTERECTOMY     age 65 TAH b/l SPO for endometriosis  . TONSILLECTOMY    . WISDOM TOOTH EXTRACTION     Family History  Problem Relation Age of Onset  . Heart disease Mother        MI  . Cancer Father        lung cancer, small cell, smoker  . Hypertension Sister   . Dementia Sister   . Alcohol abuse Brother   . Cancer Maternal Grandmother   . Dementia Sister   . Arthritis Sister        Rheumtoid   . Alzheimer's disease Sister   . Cancer Brother    Social History   Socioeconomic History  . Marital status: Married    Spouse name: Not on file  . Number of children: Not on file  . Years of education: Not on file  . Highest education level: Not on file  Occupational History  . Not on file  Tobacco Use  . Smoking status: Never Smoker  . Smokeless tobacco: Never Used  Substance and Sexual Activity  . Alcohol use: No  . Drug use: No  . Sexual activity: Yes  Other Topics Concern  . 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 Walks and Y deep water exercise   Raised 2 stepkids,  son and daughter   Social Determinants of Health   Financial Resource Strain: Low Risk   . Difficulty of Paying Living Expenses: Not hard at all  Food Insecurity:   . Worried About Charity fundraiser in the Last Year: Not on file  . Ran Out of Food in the Last Year: Not on file  Transportation Needs:   . Lack of Transportation (Medical): Not on file  . Lack of Transportation (Non-Medical): Not on file  Physical Activity:   . Days of Exercise per Week: Not on file  . Minutes of Exercise per Session: Not on file  Stress:   . Feeling of Stress : Not on file  Social Connections:   . Frequency of Communication with Friends and Family: Not on file  . Frequency of Social Gatherings with Friends and Family: Not on file  . Attends Religious Services: Not on file  . Active  Member of Clubs or Organizations: Not on file  . Attends Archivist Meetings: Not on file  . Marital Status: Not on file    Outpatient Encounter Medications as of 09/08/2019  Medication Sig  . Multiple Vitamins-Minerals (MULTIVITAMIN WITH MINERALS) tablet Take 1 tablet by mouth daily.  Marland Kitchen Specialty Vitamins Products (ONE-A-DAY BONE STRENGTH PO) Take by mouth 3 (three) times daily.   No facility-administered encounter medications on file as of 09/08/2019.    Activities of Daily Living In your present state of health, do you have any difficulty performing the following activities: 09/08/2019  Hearing? N  Vision? Y  Comment hearing aid in right ear.  Difficulty concentrating or making decisions? N  Walking or climbing stairs? N  Dressing or bathing? N  Doing errands, shopping? N  Preparing Food and eating ? N  Using the Toilet? N  In the past six months, have you accidently leaked urine? N  Do you have problems with loss of bowel control? N  Managing your Medications? N  Managing your Finances? N  Housekeeping or managing your Housekeeping? N  Some recent data might be hidden    Patient Care Team: [name], MD as PCP - General (Family Medicine)    Assessment:   This is a routine wellness examination for Kaitlyn Hanna. Physical assessment deferred to PCP.  Exercise Activities and Dietary recommendations Current Exercise Habits: Home exercise routine, Type of exercise: walking, Time (Minutes): 30, Frequency (Times/Week): 2, Weekly Exercise (Minutes/Week): 60, Intensity: Mild, Exercise limited by: None identified   Diet (meal preparation, eat out, water intake, caffeinated beverages, dairy products, fruits and vegetables): in general, a "healthy" diet  , well balanced     Goals    . Increase physical activity       Fall Risk Fall Risk  09/08/2019 09/05/2018 04/14/2016  Falls in the past year? 0 0 No  Number falls in past yr: 0 - -  Injury with Fall? 0 - -  Follow up  Education provided;Falls prevention discussed - -    Depression Screen PHQ 2/9 Scores 09/08/2019 09/05/2018 04/14/2016  PHQ - 2 Score 0 0 0     Cognitive Function Ad8 score reviewed for issues:  Issues making decisions:no  Less interest in hobbies / activities:no  Repeats questions, stories (family complaining):no  Trouble using ordinary gadgets (microwave, computer, phone):no  Forgets the month or year: no  Mismanaging finances: no  Remembering appts:no  Daily problems with thinking and/or memory:no Ad8 score is=0         Immunization History  Administered  Date(s) Administered  . Influenza, High Dose Seasonal PF 05/29/2017  . Influenza-Unspecified 06/22/2016, 05/24/2018     Screening Tests Health Maintenance  Topic Date Due  . TETANUS/TDAP  01/09/1962  . PNA vac Low Risk Adult (1 of 2 - PCV13) 01/10/2008  . INFLUENZA VACCINE  03/25/2019  . DEXA SCAN  Completed    Plan:    Please schedule your next medicare wellness visit with me in 1 yr.  Continue to eat heart healthy diet (full of fruits, vegetables, whole grains, lean protein, water--limit salt, fat, and sugar intake) and increase physical activity as tolerated.  Continue doing brain stimulating activities (puzzles, reading, adult coloring books, staying active) to keep memory sharp.     I have personally reviewed and noted the following in the patient's chart:   . Medical and social history . Use of alcohol, tobacco or illicit drugs  . Current medications and supplements . Functional ability and status . Nutritional status . Physical activity . Advanced directives . List of other physicians . Hospitalizations, surgeries, and ER visits in previous 12 months . Vitals . Screenings to include cognitive, depression, and falls . Referrals and appointments  In addition, I have reviewed and discussed with patient certain preventive protocols, quality metrics, and best practice recommendations. A  written personalized care plan for preventive services as well as general preventive health recommendations were provided to patient.     Shela Nevin, [place]  09/08/2019

## 2019-09-08 ENCOUNTER — Ambulatory Visit (INDEPENDENT_AMBULATORY_CARE_PROVIDER_SITE_OTHER): Payer: Medicare Other | Admitting: *Deleted

## 2019-09-08 ENCOUNTER — Encounter: Payer: Self-pay | Admitting: *Deleted

## 2019-09-08 ENCOUNTER — Other Ambulatory Visit: Payer: Self-pay

## 2019-09-08 DIAGNOSIS — Z0000 Encounter for general adult medical examination without abnormal findings: Secondary | ICD-10-CM

## 2019-09-08 NOTE — Patient Instructions (Signed)
Please schedule your next medicare wellness visit with me in 1 yr.  Continue to eat heart healthy diet (full of fruits, vegetables, whole grains, lean protein, water--limit salt, fat, and sugar intake) and increase physical activity as tolerated.  Continue doing brain stimulating activities (puzzles, reading, adult coloring books, staying active) to keep memory sharp.    Kaitlyn Hanna , Thank you for taking time to come for your Medicare Wellness Visit. I appreciate your ongoing commitment to your health goals. Please review the following plan we discussed and let me know if I can assist you in the future.   These are the goals we discussed: Goals    . Increase physical activity       This is a list of the screening recommended for you and due dates:  Health Maintenance  Topic Date Due  . Tetanus Vaccine  01/09/1962  . Pneumonia vaccines (1 of 2 - PCV13) 01/10/2008  . Flu Shot  03/25/2019  . DEXA scan (bone density measurement)  Completed    Preventive Care 65 Years and Older, Female Preventive care refers to lifestyle choices and visits with your health care provider that can promote health and wellness. This includes:  A yearly physical exam. This is also called an annual well check.  Regular dental and eye exams.  Immunizations.  Screening for certain conditions.  Healthy lifestyle choices, such as diet and exercise. What can I expect for my preventive care visit? Physical exam Your health care provider will check:  Height and weight. These may be used to calculate body mass index (BMI), which is a measurement that tells if you are at a healthy weight.  Heart rate and blood pressure.  Your skin for abnormal spots. Counseling Your health care provider may ask you questions about:  Alcohol, tobacco, and drug use.  Emotional well-being.  Home and relationship well-being.  Sexual activity.  Eating habits.  History of falls.  Memory and ability to understand  (cognition).  Work and work Statistician.  Pregnancy and menstrual history. What immunizations do I need?  Influenza (flu) vaccine  This is recommended every year. Tetanus, diphtheria, and pertussis (Tdap) vaccine  You may need a Td booster every 10 years. Varicella (chickenpox) vaccine  You may need this vaccine if you have not already been vaccinated. Zoster (shingles) vaccine  You may need this after age 50. Pneumococcal conjugate (PCV13) vaccine  One dose is recommended after age 65. Pneumococcal polysaccharide (PPSV23) vaccine  One dose is recommended after age 43. Measles, mumps, and rubella (MMR) vaccine  You may need at least one dose of MMR if you were born in 1957 or later. You may also need a second dose. Meningococcal conjugate (MenACWY) vaccine  You may need this if you have certain conditions. Hepatitis A vaccine  You may need this if you have certain conditions or if you travel or work in places where you may be exposed to hepatitis A. Hepatitis B vaccine  You may need this if you have certain conditions or if you travel or work in places where you may be exposed to hepatitis B. Haemophilus influenzae type b (Hib) vaccine  You may need this if you have certain conditions. You may receive vaccines as individual doses or as more than one vaccine together in one shot (combination vaccines). Talk with your health care provider about the risks and benefits of combination vaccines. What tests do I need? Blood tests  Lipid and cholesterol levels. These may be checked every  5 years, or more frequently depending on your overall health.  Hepatitis C test.  Hepatitis B test. Screening  Lung cancer screening. You may have this screening every year starting at age 66 if you have a 30-pack-year history of smoking and currently smoke or have quit within the past 15 years.  Colorectal cancer screening. All adults should have this screening starting at age 39 and  continuing until age 58. Your health care provider may recommend screening at age 14 if you are at increased risk. You will have tests every 1-10 years, depending on your results and the type of screening test.  Diabetes screening. This is done by checking your blood sugar (glucose) after you have not eaten for a while (fasting). You may have this done every 1-3 years.  Mammogram. This may be done every 1-2 years. Talk with your health care provider about how often you should have regular mammograms.  BRCA-related cancer screening. This may be done if you have a family history of breast, ovarian, tubal, or peritoneal cancers. Other tests  Sexually transmitted disease (STD) testing.  Bone density scan. This is done to screen for osteoporosis. You may have this done starting at age 92. Follow these instructions at home: Eating and drinking  Eat a diet that includes fresh fruits and vegetables, whole grains, lean protein, and low-fat dairy products. Limit your intake of foods with high amounts of sugar, saturated fats, and salt.  Take vitamin and mineral supplements as recommended by your health care provider.  Do not drink alcohol if your health care provider tells you not to drink.  If you drink alcohol: ? Limit how much you have to 0-1 drink a day. ? Be aware of how much alcohol is in your drink. In the [place] one drink equals one 12 oz bottle of beer (355 mL), one 5 oz glass of wine (148 mL), or one 1 oz glass of hard liquor (44 mL). Lifestyle  Take daily care of your teeth and gums.  Stay active. Exercise for at least 30 minutes on 5 or more days each week.  Do not use any products that contain nicotine or tobacco, such as cigarettes, e-cigarettes, and chewing tobacco. If you need help quitting, ask your health care provider.  If you are sexually active, practice safe sex. Use a condom or other form of protection in order to prevent STIs (sexually transmitted infections).  Talk  with your health care provider about taking a low-dose aspirin or statin. What's next?  Go to your health care provider once a year for a well check visit.  Ask your health care provider how often you should have your eyes and teeth checked.  Stay up to date on all vaccines. This information is not intended to replace advice given to you by your health care provider. Make sure you discuss any questions you have with your health care provider. Document Revised: 08/04/2018 Document Reviewed: 08/04/2018 Elsevier Patient Education  2020 [employer].

## 2019-09-12 ENCOUNTER — Ambulatory Visit: Payer: Medicare Other | Attending: Internal Medicine

## 2019-09-12 DIAGNOSIS — Z23 Encounter for immunization: Secondary | ICD-10-CM

## 2019-09-12 NOTE — Progress Notes (Signed)
   Covid-19 Vaccination Clinic  Name:  Kaitlyn Hanna    MRN: [id number] DOB: [birth year]  09/12/2019  [name] was observed post Covid-19 immunization for 15 minutes without incidence. She was provided with Vaccine Information Sheet and instruction to access the V-Safe system.   [name] was instructed to call 911 with any severe reactions post vaccine: Marland Kitchen Difficulty breathing  . Swelling of your face and throat  . A fast heartbeat  . A bad rash all over your body  . Dizziness and weakness    Immunizations Administered    Name Date Dose VIS Date Route   Pfizer COVID-19 Vaccine 09/12/2019  3:03 PM 0.3 mL 08/04/2019 Intramuscular   Manufacturer: [place]   Lot: [id number]   [place]: [id number]

## 2019-10-02 ENCOUNTER — Ambulatory Visit: Payer: Medicare Other | Attending: Internal Medicine

## 2019-10-02 DIAGNOSIS — Z23 Encounter for immunization: Secondary | ICD-10-CM

## 2019-10-02 NOTE — Progress Notes (Signed)
   Covid-19 Vaccination Clinic  Name:  SHERAE KARNOPP    MRN: [id number] DOB: April 20, 1941  10/02/2019  Ms. Mayer was observed post Covid-19 immunization for 15 minutes without incidence. She was provided with Vaccine Information Sheet and instruction to access the V-Safe system.   Ms. Fowler was instructed to call 911 with any severe reactions post vaccine: Marland Kitchen Difficulty breathing  . Swelling of your face and throat  . A fast heartbeat  . A bad rash all over your body  . Dizziness and weakness    Immunizations Administered    Name Date Dose VIS Date Route   Pfizer COVID-19 Vaccine 10/02/2019 11:42 AM 0.3 mL 08/04/2019 Intramuscular   Manufacturer: [place]   Lot: [id number]   [place]: [id number]

## 2019-10-03 ENCOUNTER — Encounter: Payer: Medicare Other | Admitting: Family Medicine

## 2019-11-08 ENCOUNTER — Other Ambulatory Visit: Payer: Self-pay

## 2019-11-09 ENCOUNTER — Encounter: Payer: Medicare Other | Admitting: Family Medicine

## 2019-12-18 ENCOUNTER — Other Ambulatory Visit: Payer: Self-pay

## 2019-12-18 ENCOUNTER — Ambulatory Visit (INDEPENDENT_AMBULATORY_CARE_PROVIDER_SITE_OTHER): Payer: Medicare Other | Admitting: Family Medicine

## 2019-12-18 ENCOUNTER — Encounter: Payer: Self-pay | Admitting: Family Medicine

## 2019-12-18 VITALS — BP 107/66 | HR 82 | Temp 98.4°F | Resp 12 | Ht 60.4 in | Wt 150.0 lb

## 2019-12-18 DIAGNOSIS — Z0000 Encounter for general adult medical examination without abnormal findings: Secondary | ICD-10-CM

## 2019-12-18 DIAGNOSIS — M810 Age-related osteoporosis without current pathological fracture: Secondary | ICD-10-CM | POA: Diagnosis not present

## 2019-12-18 DIAGNOSIS — E782 Mixed hyperlipidemia: Secondary | ICD-10-CM | POA: Diagnosis not present

## 2019-12-18 NOTE — Assessment & Plan Note (Signed)
Tolerating statin, encouraged heart healthy diet, avoid trans fats, minimize simple carbs and saturated fats. Increase exercise as tolerated 

## 2019-12-18 NOTE — Patient Instructions (Addendum)
[employer] [place] by Len Childs, book on state of healthcare delivery in [place]  Omron Blood Pressure cuff, upper arm, want BP 100-140/60-90 Pulse oximeter, want oxygen in 90s  Weekly vitals  Take Multivitamin with minerals, selenium Vitamin D 1000-2000 IU daily Probiotic with lactobacillus and bifidophilus Asprin EC 81 mg daily Fish oil or krill oil caps  Melatonin 2-5 mg at bedtime  [email or web address]  Simple carbohydrates accelerate aging in all systems  Preventive Care 65 Years and Older, Female Preventive care refers to lifestyle choices and visits with your health care provider that can promote health and wellness. This includes:  A yearly physical exam. This is also called an annual well check.  Regular dental and eye exams.  Immunizations.  Screening for certain conditions.  Healthy lifestyle choices, such as diet and exercise. What can I expect for my preventive care visit? Physical exam Your health care provider will check:  Height and weight. These may be used to calculate body mass index (BMI), which is a measurement that tells if you are at a healthy weight.  Heart rate and blood pressure.  Your skin for abnormal spots. Counseling Your health care provider may ask you questions about:  Alcohol, tobacco, and drug use.  Emotional well-being.  Home and relationship well-being.  Sexual activity.  Eating habits.  History of falls.  Memory and ability to understand (cognition).  Work and work Statistician.  Pregnancy and menstrual history. What immunizations do I need?  Influenza (flu) vaccine  This is recommended every year. Tetanus, diphtheria, and pertussis (Tdap) vaccine  You may need a Td booster every 10 years. Varicella (chickenpox) vaccine  You may need this vaccine if you have not already been vaccinated. Zoster (shingles) vaccine  You may need this after age 44. Pneumococcal conjugate (PCV13)  vaccine  One dose is recommended after age 42. Pneumococcal polysaccharide (PPSV23) vaccine  One dose is recommended after age 33. Measles, mumps, and rubella (MMR) vaccine  You may need at least one dose of MMR if you were born in 1957 or later. You may also need a second dose. Meningococcal conjugate (MenACWY) vaccine  You may need this if you have certain conditions. Hepatitis A vaccine  You may need this if you have certain conditions or if you travel or work in places where you may be exposed to hepatitis A. Hepatitis B vaccine  You may need this if you have certain conditions or if you travel or work in places where you may be exposed to hepatitis B. Haemophilus influenzae type b (Hib) vaccine  You may need this if you have certain conditions. You may receive vaccines as individual doses or as more than one vaccine together in one shot (combination vaccines). Talk with your health care provider about the risks and benefits of combination vaccines. What tests do I need? Blood tests  Lipid and cholesterol levels. These may be checked every 5 years, or more frequently depending on your overall health.  Hepatitis C test.  Hepatitis B test. Screening  Lung cancer screening. You may have this screening every year starting at age 75 if you have a 30-pack-year history of smoking and currently smoke or have quit within the past 15 years.  Colorectal cancer screening. All adults should have this screening starting at age 35 and continuing until age 20. Your health care provider may recommend screening at age 72 if you are at increased risk. You will have tests every 1-10 years, depending on your results and the  type of screening test.  Diabetes screening. This is done by checking your blood sugar (glucose) after you have not eaten for a while (fasting). You may have this done every 1-3 years.  Mammogram. This may be done every 1-2 years. Talk with your health care provider about how  often you should have regular mammograms.  BRCA-related cancer screening. This may be done if you have a family history of breast, ovarian, tubal, or peritoneal cancers. Other tests  Sexually transmitted disease (STD) testing.  Bone density scan. This is done to screen for osteoporosis. You may have this done starting at age 40. Follow these instructions at home: Eating and drinking  Eat a diet that includes fresh fruits and vegetables, whole grains, lean protein, and low-fat dairy products. Limit your intake of foods with high amounts of sugar, saturated fats, and salt.  Take vitamin and mineral supplements as recommended by your health care provider.  Do not drink alcohol if your health care provider tells you not to drink.  If you drink alcohol: ? Limit how much you have to 0-1 drink a day. ? Be aware of how much alcohol is in your drink. In the [place] one drink equals one 12 oz bottle of beer (355 mL), one 5 oz glass of wine (148 mL), or one 1 oz glass of hard liquor (44 mL). Lifestyle  Take daily care of your teeth and gums.  Stay active. Exercise for at least 30 minutes on 5 or more days each week.  Do not use any products that contain nicotine or tobacco, such as cigarettes, e-cigarettes, and chewing tobacco. If you need help quitting, ask your health care provider.  If you are sexually active, practice safe sex. Use a condom or other form of protection in order to prevent STIs (sexually transmitted infections).  Talk with your health care provider about taking a low-dose aspirin or statin. What's next?  Go to your health care provider once a year for a well check visit.  Ask your health care provider how often you should have your eyes and teeth checked.  Stay up to date on all vaccines. This information is not intended to replace advice given to you by your health care provider. Make sure you discuss any questions you have with your health care provider. Document  Revised: 08/04/2018 Document Reviewed: 08/04/2018 Elsevier Patient Education  2020 [employer].

## 2019-12-18 NOTE — Assessment & Plan Note (Addendum)
Patient encouraged to maintain heart healthy diet, regular exercise, adequate sleep. Consider daily probiotics. Take medications as prescribed. Labs ordered and reviewed. Requested last pap and mgm from her OB/GYN. Also immunization record from previous pmd.

## 2019-12-18 NOTE — Progress Notes (Signed)
Subjective:    Patient ID: Kaitlyn Hanna, female    DOB: 07/28/1941, 79 y.o.   MRN: [id number]  Chief Complaint  Patient presents with  . Annual Exam    HPI Patient is in today for annual preventative exam. No recent febrile illness or hospitalizations. She has been trying to maintain a heart healthy diet and to stay active. She is following with [facility] and so far she has chosen not to have surgery on her Swannoma. No headaches just persistent hearing loss. Denies CP/palp/SOB/HA/congestion/fevers/GI or GU c/o. Taking meds as prescribed  Past Medical History:  Diagnosis Date  . Benign schwannoma 06/30/2016  . H/O measles   . Hearing loss in right ear 04/14/2016  . Osteoporosis 04/14/2016  . Raynaud's phenomenon 04/14/2016  . Schwannoma of cranial nerve ([place]) 06/30/2016    Past Surgical History:  Procedure Laterality Date  . ABDOMINAL HYSTERECTOMY     age 80 TAH b/l SPO for endometriosis  . TONSILLECTOMY    . WISDOM TOOTH EXTRACTION      Family History  Problem Relation Age of Onset  . Heart disease Mother        MI  . Cancer Father        lung cancer, small cell, smoker  . Hypertension Sister   . Dementia Sister   . Alcohol abuse Brother   . Cancer Maternal Grandmother   . Dementia Sister   . Arthritis Sister        Rheumtoid   . Alzheimer's disease Sister   . Cancer Brother     Social History   Socioeconomic History  . Marital status: Married    Spouse name: Not on file  . Number of children: Not on file  . Years of education: Not on file  . Highest education level: Not on file  Occupational History  . Not on file  Tobacco Use  . Smoking status: Never Smoker  . Smokeless tobacco: Never Used  Substance and Sexual Activity  . Alcohol use: No  . Drug use: No  . Sexual activity: Yes  Other Topics Concern  . 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 Walks and Y deep water exercise   Raised 2 stepkids, son and daughter   Social Determinants of Health   Financial Resource Strain: Low Risk   . Difficulty of Paying Living Expenses: Not hard at all  Food Insecurity:   . Worried About Charity fundraiser in the Last Year:   . Arboriculturist in the Last Year:   Transportation Needs:   . Film/video editor (Medical):   Marland Kitchen Lack of Transportation (Non-Medical):   Physical Activity:   . Days of Exercise per Week:   . Minutes of Exercise per Session:   Stress:   . Feeling of Stress :   Social Connections:   . Frequency of Communication with Friends and Family:   . Frequency of Social Gatherings with Friends and Family:   . Attends Religious Services:   . Active Member of Clubs or Organizations:   . Attends Archivist Meetings:   Marland Kitchen Marital Status:   Intimate Partner Violence:   . Fear of Current or Ex-Partner:   . Emotionally Abused:   Marland Kitchen Physically Abused:   . Sexually Abused:     Outpatient Medications Prior to Visit  Medication Sig Dispense Refill  . Multiple Vitamins-Minerals (MULTIVITAMIN WITH MINERALS) tablet Take 1 tablet by mouth daily.    Marland Kitchen Specialty Vitamins Products (ONE-A-DAY BONE STRENGTH PO) Take by mouth 3 (three) times daily.     No facility-administered medications prior to visit.    No Known Allergies  Review of Systems  Constitutional: Negative for chills, fever and malaise/fatigue.  HENT: Negative for congestion and hearing loss.   Eyes: Negative for discharge.  Respiratory: Negative for cough, sputum production and shortness of breath.   Cardiovascular: Negative for chest pain, palpitations and leg swelling.  Gastrointestinal: Negative for abdominal pain, blood in stool, constipation, diarrhea, heartburn, nausea and vomiting.  Genitourinary: Negative for dysuria, frequency, hematuria and urgency.  Musculoskeletal: Negative for back pain, falls and myalgias.  Skin: Negative  for rash.  Neurological: Negative for dizziness, sensory change, loss of consciousness, weakness and headaches.  Endo/Heme/Allergies: Negative for environmental allergies. Does not bruise/bleed easily.  Psychiatric/Behavioral: Negative for depression and suicidal ideas. The patient is not nervous/anxious and does not have insomnia.        Objective:    Physical Exam Vitals and nursing note reviewed.  Constitutional:      General: She is not in acute distress.    Appearance: She is well-developed.  HENT:     Head: Normocephalic and atraumatic.     Right Ear: Tympanic membrane, ear canal and external ear normal. There is no impacted cerumen.     Left Ear: Tympanic membrane, ear canal and external ear normal. There is no impacted cerumen.     Nose: Nose normal.  Eyes:     General:        Right eye: No discharge.        Left eye: No discharge.     Extraocular Movements: Extraocular movements intact.     Pupils: Pupils are equal, round, and reactive to light.  Cardiovascular:     Rate and Rhythm: Normal rate and regular rhythm.     Heart sounds: No murmur.  Pulmonary:     Effort: Pulmonary effort is normal.     Breath sounds: No wheezing.  Abdominal:     General: Bowel sounds are normal.     Palpations: Abdomen is soft. There is no mass.     Tenderness: There is no abdominal tenderness. There is no guarding.  Musculoskeletal:     Cervical back: Normal range of motion and neck supple.  Skin:    General: Skin is warm and dry.  Neurological:     Mental Status: She is alert and oriented to person, place, and time.  Psychiatric:        Behavior: Behavior normal.     BP 107/66 (BP Location: Left Arm, Cuff Size: Large)   Pulse 82   Temp 98.4 F (36.9 C) (Temporal)   Resp 12   Ht 5' 0.4" (1.534 m)   Wt 150 lb (68 kg)   LMP  (LMP Unknown)   SpO2 100%   BMI 28.91 kg/m  Wt Readings from Last 3 Encounters:  12/18/19 150 lb (68 kg)  09/30/18 143 lb 3.2 oz (65 kg)  09/05/18  141 lb 6.4 oz (64.1 kg)    Diabetic Foot Exam - Simple   No data filed     Lab Results  Component Value Date   WBC 6.4 09/30/2018   HGB 12.6 09/30/2018   HCT 38.0 09/30/2018   PLT 246.0 09/30/2018   GLUCOSE 80 09/30/2018   CHOL 224 (H)  09/30/2018   TRIG 141.0 09/30/2018   HDL 44.40 09/30/2018   LDLCALC 151 (H) 09/30/2018   ALT 11 09/30/2018   AST 16 09/30/2018   NA 138 09/30/2018   K 4.3 09/30/2018   CL 101 09/30/2018   CREATININE 0.86 09/30/2018   BUN 15 09/30/2018   CO2 29 09/30/2018   TSH 4.32 09/30/2018    Lab Results  Component Value Date   TSH 4.32 09/30/2018   Lab Results  Component Value Date   WBC 6.4 09/30/2018   HGB 12.6 09/30/2018   HCT 38.0 09/30/2018   MCV 87.8 09/30/2018   PLT 246.0 09/30/2018   Lab Results  Component Value Date   NA 138 09/30/2018   K 4.3 09/30/2018   CO2 29 09/30/2018   GLUCOSE 80 09/30/2018   BUN 15 09/30/2018   CREATININE 0.86 09/30/2018   BILITOT 0.4 09/30/2018   ALKPHOS 88 09/30/2018   AST 16 09/30/2018   ALT 11 09/30/2018   PROT 7.4 09/30/2018   ALBUMIN 4.2 09/30/2018   CALCIUM 9.0 09/30/2018   GFR 64.20 09/30/2018   Lab Results  Component Value Date   CHOL 224 (H) 09/30/2018   Lab Results  Component Value Date   HDL 44.40 09/30/2018   Lab Results  Component Value Date   LDLCALC 151 (H) 09/30/2018   Lab Results  Component Value Date   TRIG 141.0 09/30/2018   Lab Results  Component Value Date   CHOLHDL 5 09/30/2018   No results found for: HGBA1C     Assessment & Plan:   Problem List Items Addressed This Visit    Hyperlipidemia, mixed - Primary    Tolerating statin, encouraged heart healthy diet, avoid trans fats, minimize simple carbs and saturated fats. Increase exercise as tolerated      Relevant Orders   Lipid panel   Osteoporosis    Encouraged to get adequate exercise, calcium and vitamin d intake. Dexa scan ordered.       Relevant Orders   Comprehensive metabolic panel   VITAMIN  D 25 Hydroxy (Vit-D Deficiency, Fractures)   DG Bone Density   Preventative health care    Patient encouraged to maintain heart healthy diet, regular exercise, adequate sleep. Consider daily probiotics. Take medications as prescribed. Labs ordered and reviewed. Requested last pap and mgm from her OB/GYN. Also immunization record from previous pmd.       Relevant Orders   CBC   Comprehensive metabolic panel   TSH      I am having Clista Bernhardt maintain her multivitamin with minerals and Specialty Vitamins Products (ONE-A-DAY BONE STRENGTH PO).  No orders of the defined types were placed in this encounter.    Penni Homans, MD

## 2019-12-18 NOTE — Assessment & Plan Note (Addendum)
Encouraged to get adequate exercise, calcium and vitamin d intake. Dexa scan ordered 

## 2019-12-21 ENCOUNTER — Other Ambulatory Visit (INDEPENDENT_AMBULATORY_CARE_PROVIDER_SITE_OTHER): Payer: Medicare Other

## 2019-12-21 ENCOUNTER — Other Ambulatory Visit: Payer: Self-pay

## 2019-12-21 ENCOUNTER — Ambulatory Visit (HOSPITAL_BASED_OUTPATIENT_CLINIC_OR_DEPARTMENT_OTHER)
Admission: RE | Admit: 2019-12-21 | Discharge: 2019-12-21 | Disposition: A | Discharge status: Home / Self Care | Payer: Medicare Other | Source: Ambulatory Visit | Attending: Family Medicine | Admitting: Family Medicine

## 2019-12-21 DIAGNOSIS — E782 Mixed hyperlipidemia: Secondary | ICD-10-CM | POA: Diagnosis not present

## 2019-12-21 DIAGNOSIS — Z0000 Encounter for general adult medical examination without abnormal findings: Secondary | ICD-10-CM

## 2019-12-21 DIAGNOSIS — M810 Age-related osteoporosis without current pathological fracture: Secondary | ICD-10-CM

## 2019-12-21 DIAGNOSIS — Z780 Asymptomatic menopausal state: Secondary | ICD-10-CM | POA: Diagnosis not present

## 2019-12-21 LAB — COMPREHENSIVE METABOLIC PANEL
ALT: 12 U/L (ref 0–35)
AST: 18 U/L (ref 0–37)
Albumin: 4 g/dL (ref 3.5–5.2)
Alkaline Phosphatase: 90 U/L (ref 39–117)
BUN: 17 mg/dL (ref 6–23)
CO2: 29 mEq/L (ref 19–32)
Calcium: 8.9 mg/dL (ref 8.4–10.5)
Chloride: 102 mEq/L (ref 96–112)
Creatinine, Ser: 0.95 mg/dL (ref 0.40–1.20)
GFR: 57.05 mL/min — ABNORMAL LOW (ref >60.00)
Glucose, Bld: 92 mg/dL (ref 70–99)
Potassium: 4.5 mEq/L (ref 3.5–5.1)
Sodium: 138 mEq/L (ref 135–145)
Total Bilirubin: 0.5 mg/dL (ref 0.2–1.2)
Total Protein: 7.1 g/dL (ref 6.0–8.3)

## 2019-12-21 LAB — CBC
HCT: 36.9 % (ref 36.0–46.0)
Hemoglobin: 12.3 g/dL (ref 12.0–15.0)
MCHC: 33.2 g/dL (ref 30.0–36.0)
MCV: 88.1 fl (ref 78.0–100.0)
Platelets: 240 10*3/uL (ref 150.0–400.0)
RBC: 4.19 Mil/uL (ref 3.87–5.11)
RDW: 13.8 % (ref 11.5–15.5)
WBC: 4.7 10*3/uL (ref 4.0–10.5)

## 2019-12-21 LAB — LIPID PANEL
Cholesterol: 220 mg/dL — ABNORMAL HIGH (ref 0–200)
HDL: 42.6 mg/dL (ref >39.00)
LDL Cholesterol: 151 mg/dL — ABNORMAL HIGH (ref 0–99)
NonHDL: 177.6
Total CHOL/HDL Ratio: 5
Triglycerides: 133 mg/dL (ref 0.0–149.0)
VLDL: 26.6 mg/dL (ref 0.0–40.0)

## 2019-12-21 LAB — TSH: TSH: 5.08 u[IU]/mL — ABNORMAL HIGH (ref 0.35–4.50)

## 2019-12-21 LAB — VITAMIN D 25 HYDROXY (VIT D DEFICIENCY, FRACTURES): VITD: 42.04 ng/mL (ref 30.00–100.00)

## 2019-12-21 NOTE — Addendum Note (Signed)
Addended by: Kelle Darting A on: 12/21/2019 08:12 AM   Modules accepted: Orders

## 2019-12-22 ENCOUNTER — Other Ambulatory Visit (INDEPENDENT_AMBULATORY_CARE_PROVIDER_SITE_OTHER): Payer: Medicare Other

## 2019-12-22 DIAGNOSIS — R7989 Other specified abnormal findings of blood chemistry: Secondary | ICD-10-CM

## 2019-12-22 LAB — T4, FREE: Free T4: 0.85 ng/dL (ref 0.60–1.60)

## 2019-12-26 ENCOUNTER — Other Ambulatory Visit: Payer: Self-pay | Admitting: *Deleted

## 2019-12-26 DIAGNOSIS — R7989 Other specified abnormal findings of blood chemistry: Secondary | ICD-10-CM

## 2020-02-01 DIAGNOSIS — L720 Epidermal cyst: Secondary | ICD-10-CM | POA: Diagnosis not present

## 2020-02-19 DIAGNOSIS — D37030 Neoplasm of uncertain behavior of the parotid salivary glands: Secondary | ICD-10-CM | POA: Diagnosis not present

## 2020-02-20 ENCOUNTER — Other Ambulatory Visit: Payer: Self-pay | Admitting: Otolaryngology

## 2020-02-20 ENCOUNTER — Encounter: Payer: Self-pay | Admitting: Family Medicine

## 2020-02-20 DIAGNOSIS — K118 Other diseases of salivary glands: Secondary | ICD-10-CM

## 2020-02-23 ENCOUNTER — Other Ambulatory Visit: Payer: Self-pay

## 2020-02-23 ENCOUNTER — Ambulatory Visit
Admission: RE | Admit: 2020-02-23 | Discharge: 2020-02-23 | Disposition: A | Discharge status: Home / Self Care | Payer: Medicare Other | Source: Ambulatory Visit | Attending: Otolaryngology | Admitting: Otolaryngology

## 2020-02-23 DIAGNOSIS — R221 Localized swelling, mass and lump, neck: Secondary | ICD-10-CM | POA: Diagnosis not present

## 2020-02-23 DIAGNOSIS — K118 Other diseases of salivary glands: Secondary | ICD-10-CM

## 2020-02-23 MED ORDER — IOPAMIDOL (ISOVUE-300) INJECTION 61%
75.0000 mL | Freq: Once | INTRAVENOUS | Status: AC | PRN
  Administered 2020-02-23: 75 mL via INTRAVENOUS

## 2020-03-05 DIAGNOSIS — R42 Dizziness and giddiness: Secondary | ICD-10-CM | POA: Diagnosis not present

## 2020-03-05 DIAGNOSIS — D333 Benign neoplasm of cranial nerves: Secondary | ICD-10-CM | POA: Diagnosis not present

## 2020-03-05 DIAGNOSIS — H9041 Sensorineural hearing loss, unilateral, right ear, with unrestricted hearing on the contralateral side: Secondary | ICD-10-CM | POA: Diagnosis not present

## 2020-03-05 DIAGNOSIS — M3500 Sicca syndrome, unspecified: Secondary | ICD-10-CM | POA: Diagnosis not present

## 2020-03-18 DIAGNOSIS — K111 Hypertrophy of salivary gland: Secondary | ICD-10-CM | POA: Diagnosis not present

## 2020-03-18 DIAGNOSIS — M3500 Sicca syndrome, unspecified: Secondary | ICD-10-CM | POA: Diagnosis not present

## 2020-03-18 DIAGNOSIS — M1990 Unspecified osteoarthritis, unspecified site: Secondary | ICD-10-CM | POA: Diagnosis not present

## 2020-03-18 DIAGNOSIS — D8989 Other specified disorders involving the immune mechanism, not elsewhere classified: Secondary | ICD-10-CM | POA: Diagnosis not present

## 2020-03-18 DIAGNOSIS — Z7689 Persons encountering health services in other specified circumstances: Secondary | ICD-10-CM | POA: Diagnosis not present

## 2020-03-18 DIAGNOSIS — I7300 Raynaud's syndrome without gangrene: Secondary | ICD-10-CM | POA: Diagnosis not present

## 2020-04-15 DIAGNOSIS — I7300 Raynaud's syndrome without gangrene: Secondary | ICD-10-CM | POA: Diagnosis not present

## 2020-04-15 DIAGNOSIS — M3500 Sicca syndrome, unspecified: Secondary | ICD-10-CM | POA: Diagnosis not present

## 2020-04-15 DIAGNOSIS — K111 Hypertrophy of salivary gland: Secondary | ICD-10-CM | POA: Diagnosis not present

## 2020-04-15 DIAGNOSIS — M1990 Unspecified osteoarthritis, unspecified site: Secondary | ICD-10-CM | POA: Diagnosis not present

## 2020-04-26 DIAGNOSIS — Z1231 Encounter for screening mammogram for malignant neoplasm of breast: Secondary | ICD-10-CM | POA: Diagnosis not present

## 2020-04-26 LAB — HM MAMMOGRAPHY

## 2020-05-02 DIAGNOSIS — H43813 Vitreous degeneration, bilateral: Secondary | ICD-10-CM | POA: Diagnosis not present

## 2020-05-02 DIAGNOSIS — H524 Presbyopia: Secondary | ICD-10-CM | POA: Diagnosis not present

## 2020-05-02 DIAGNOSIS — H04123 Dry eye syndrome of bilateral lacrimal glands: Secondary | ICD-10-CM | POA: Diagnosis not present

## 2020-05-02 DIAGNOSIS — H52223 Regular astigmatism, bilateral: Secondary | ICD-10-CM | POA: Diagnosis not present

## 2020-05-02 DIAGNOSIS — H5203 Hypermetropia, bilateral: Secondary | ICD-10-CM | POA: Diagnosis not present

## 2020-07-29 DIAGNOSIS — M3500 Sicca syndrome, unspecified: Secondary | ICD-10-CM | POA: Diagnosis not present

## 2020-07-29 DIAGNOSIS — K111 Hypertrophy of salivary gland: Secondary | ICD-10-CM | POA: Diagnosis not present

## 2020-07-29 DIAGNOSIS — M1990 Unspecified osteoarthritis, unspecified site: Secondary | ICD-10-CM | POA: Diagnosis not present

## 2020-07-29 DIAGNOSIS — I7300 Raynaud's syndrome without gangrene: Secondary | ICD-10-CM | POA: Diagnosis not present

## 2020-07-29 DIAGNOSIS — D472 Monoclonal gammopathy: Secondary | ICD-10-CM | POA: Diagnosis not present

## 2020-07-31 ENCOUNTER — Encounter: Payer: Self-pay | Admitting: Family Medicine

## 2020-07-31 ENCOUNTER — Telehealth: Payer: Self-pay | Admitting: Family Medicine

## 2020-07-31 NOTE — Telephone Encounter (Signed)
If she is willing to do a home COVID test then we can bring her in. I should know but do we have COVID tests in the building. We could gown up and see her at the end of the day outside tomorrow but because we have cancer patients in the building we cannot bring COVID patients in building and until we test we do not know.

## 2020-07-31 NOTE — Telephone Encounter (Signed)
Patient states she starting getting sick on Monday. Just a little bit of shob (oxygen 90) throat feels little irritated,head feels heavy, no chills, some pressure around nose, unable to take fever (no thermometer). Patient is vaccinated, no covid test. Patient refuse virtual appointment and would like to speak to someone.

## 2020-07-31 NOTE — Telephone Encounter (Signed)
Pt will continue with the over counter meds.increase fluids and if sx worsen she will go to urgent care. Will see the cost of the at home test and go from there. Will call back if sx worsen

## 2020-07-31 NOTE — Telephone Encounter (Signed)
Pt is frustrated because she cant come in the office and be seen. Wants to be seen refused virtual appt, has been taking over the counter sudafed  and mucinex. Sounds very congested over the phone. Sx started Monday would like something sent in for relief of Sx

## 2020-08-01 ENCOUNTER — Telehealth: Payer: Self-pay | Admitting: Family Medicine

## 2020-08-01 DIAGNOSIS — U071 COVID-19: Secondary | ICD-10-CM | POA: Diagnosis not present

## 2020-08-01 NOTE — Telephone Encounter (Signed)
Patient states she feels like she has a cold... VV scheduled for 12/10 with Dr.Paz

## 2020-08-01 NOTE — Telephone Encounter (Signed)
Patient states she diagnosed with Covid 19 yesterday at urgent care, patient is calling to notify  the provider and asked for recommendations  Please advise

## 2020-08-02 ENCOUNTER — Other Ambulatory Visit: Payer: Self-pay

## 2020-08-02 ENCOUNTER — Encounter: Payer: Self-pay | Admitting: Internal Medicine

## 2020-08-02 ENCOUNTER — Telehealth (INDEPENDENT_AMBULATORY_CARE_PROVIDER_SITE_OTHER): Payer: Medicare Other | Admitting: Internal Medicine

## 2020-08-02 VITALS — Temp 96.9°F | Ht 60.4 in | Wt 154.0 lb

## 2020-08-02 DIAGNOSIS — U071 COVID-19: Secondary | ICD-10-CM

## 2020-08-02 NOTE — Progress Notes (Signed)
Pre visit review using our clinic review tool, if applicable. No additional management support is needed unless otherwise documented below in the visit note. 

## 2020-08-02 NOTE — Progress Notes (Signed)
Subjective:    Patient ID: Kaitlyn Hanna, female    DOB: 1941-03-13, 79 y.o.   MRN: [id number]  DOS:  08/02/2020 Type of visit - description: Virtual Visit via Telephone    I connected with above mentioned patient  by telephone and verified that I am speaking with the correct person using two identifiers.  THIS ENCOUNTER IS A VIRTUAL VISIT DUE TO COVID-19 - PATIENT WAS NOT SEEN IN THE OFFICE. PATIENT HAS CONSENTED TO VIRTUAL VISIT / TELEMEDICINE VISIT   Location of patient: home  Location of provider: office  Persons participating in the virtual visit: patient, provider   I discussed the limitations, risks, security and privacy concerns of performing an evaluation and management service by telephone and the availability of in person appointments. I also discussed with the patient that there may be a patient responsible charge related to this service. The patient expressed understanding and agreed to proceed.  Acute Symptoms a started December 6: Slight cough, sinus congestion, weakness, loss of taste. Went to urgent care, rapid test for Covid was + yesterday.  Denies fever chills Minus.  Production No nausea or vomiting Very mild myalgias around the waistline No rash, chest pain or difficulty breathing.     Review of Systems See above   Past Medical History:  Diagnosis Date  . Benign schwannoma 06/30/2016  . H/O measles   . Hearing loss in right ear 04/14/2016  . Osteoporosis 04/14/2016  . Raynaud's phenomenon 04/14/2016  . Schwannoma of cranial nerve ([place]) 06/30/2016    Past Surgical History:  Procedure Laterality Date  . ABDOMINAL HYSTERECTOMY     age 20 TAH b/l SPO for endometriosis  . TONSILLECTOMY    . WISDOM TOOTH EXTRACTION      Allergies as of 08/02/2020   No Known Allergies     Medication List       Accurate as of August 02, 2020 11:31 AM. If you have any questions, ask your nurse or doctor.        multivitamin with minerals tablet Take 1  tablet by mouth daily.   ONE-A-DAY BONE STRENGTH PO Take by mouth 3 (three) times daily.          Objective:   Physical Exam Temp (!) 96.9 F (36.1 C) (Temporal)   Ht 5' 0.4" (1.534 m)   Wt 154 lb (69.9 kg)   LMP  (LMP Unknown)   BMI 29.68 kg/m  We could not communicate via video, this is a telephone interview, she sounded well, in no distress, speaking in complete sentences, no cough noted    Assessment     80 y/o female, h/o Sjogren not on immunosuppressants, very healthy otherwise presents with:  COVID-19: The patient is 14, had her Covid vaccination and a booster, had a flu shot, developed respiratory symptoms 4 days ago, tested covid  positive with a rapid test yesterday. She lost her taste. Do suspect Covid on clinical grounds. We had a long conversation about the issue, multiple questions answered to the best of my ability. Recommend rest, fluids, Tylenol, Mucinex DM. Message sent to the infusion center to see if she qualifies although she is somewhat concerned about receiving monoclonal antibodies and plans to do her own research. She is aware to go to the ER if she has severe symptoms including shortness of breath chest pain. To call if not gradually better She is thinking about be retested for Covid, if she must recommend to do a PCR testing.  I discussed the assessment and treatment plan with the patient. The patient was provided an opportunity to ask questions and all were answered. The patient agreed with the plan and demonstrated an understanding of the instructions.   The patient was advised to call back or seek an in-person evaluation if the symptoms worsen or if the condition fails to improve as anticipated.  I provided 24 minutes of non-face-to-face time during this encounter.  Kathlene November, MD

## 2020-08-07 ENCOUNTER — Telehealth: Payer: Self-pay | Admitting: Unknown Physician Specialty

## 2020-08-07 NOTE — Telephone Encounter (Signed)
Called to discuss with Kaitlyn Hanna about Covid symptoms and the use of  monoclonal antibody infusion for those with mild to moderate Covid symptoms and at a high risk of hospitalization.     Pt does not qualify for infusion therapy as her symptoms first presented > 10 days prior to timing of infusion. Symptoms tier reviewed as well as criteria for ending isolation. Preventative practices reviewed. Patient verbalized understanding    Patient Active Problem List   Diagnosis Date Noted  . Sun-damaged skin 10/02/2018  . Preventative health care 06/30/2016  . Benign schwannoma 06/30/2016  . Hearing loss in right ear 04/14/2016  . Raynaud's phenomenon 04/14/2016  . Hyperlipidemia, mixed 04/14/2016  . Osteoporosis 04/14/2016

## 2020-08-08 ENCOUNTER — Encounter: Payer: Self-pay | Admitting: *Deleted

## 2020-08-08 NOTE — Progress Notes (Signed)
Reached out to [employer] to introduce myself as the office RN Navigator and explain our new patient process. Reviewed the reason for their referral and scheduled their new patient appointment along with labs. Provided address and directions to the office including call back phone number. Reviewed with patient any concerns they may have or any possible barriers to attending their appointment.   Patient was unaware of referral. Reviewed lab results and reason for referral. Patient has had multiple MD visits and a recent diagnosis of covid. She wishes to be seen in January. Appointment pushed out to January 10th. Will mail new patient packet.

## 2020-09-02 ENCOUNTER — Inpatient Hospital Stay: Payer: Medicare Other | Attending: Hematology & Oncology

## 2020-09-02 ENCOUNTER — Inpatient Hospital Stay (HOSPITAL_BASED_OUTPATIENT_CLINIC_OR_DEPARTMENT_OTHER): Payer: Medicare Other | Admitting: Hematology & Oncology

## 2020-09-02 ENCOUNTER — Encounter: Payer: Self-pay | Admitting: Hematology & Oncology

## 2020-09-02 ENCOUNTER — Other Ambulatory Visit: Payer: Self-pay

## 2020-09-02 ENCOUNTER — Telehealth: Payer: Self-pay | Admitting: Hematology & Oncology

## 2020-09-02 VITALS — BP 172/68 | HR 62 | Temp 97.5°F | Resp 18 | Ht 61.0 in | Wt 155.5 lb

## 2020-09-02 DIAGNOSIS — D472 Monoclonal gammopathy: Secondary | ICD-10-CM

## 2020-09-02 DIAGNOSIS — Z801 Family history of malignant neoplasm of trachea, bronchus and lung: Secondary | ICD-10-CM | POA: Insufficient documentation

## 2020-09-02 DIAGNOSIS — I7300 Raynaud's syndrome without gangrene: Secondary | ICD-10-CM | POA: Insufficient documentation

## 2020-09-02 DIAGNOSIS — Z7952 Long term (current) use of systemic steroids: Secondary | ICD-10-CM | POA: Diagnosis not present

## 2020-09-02 DIAGNOSIS — Z79899 Other long term (current) drug therapy: Secondary | ICD-10-CM | POA: Insufficient documentation

## 2020-09-02 DIAGNOSIS — M3500 Sicca syndrome, unspecified: Secondary | ICD-10-CM | POA: Diagnosis not present

## 2020-09-02 DIAGNOSIS — R634 Abnormal weight loss: Secondary | ICD-10-CM | POA: Insufficient documentation

## 2020-09-02 DIAGNOSIS — R700 Elevated erythrocyte sedimentation rate: Secondary | ICD-10-CM | POA: Insufficient documentation

## 2020-09-02 LAB — CBC WITH DIFFERENTIAL (CANCER CENTER ONLY)
Abs Immature Granulocytes: 0.01 10*3/uL (ref 0.00–0.07)
Basophils Absolute: 0.1 10*3/uL (ref 0.0–0.1)
Basophils Relative: 1 %
Eosinophils Absolute: 0.1 10*3/uL (ref 0.0–0.5)
Eosinophils Relative: 2 %
HCT: 38.6 % (ref 36.0–46.0)
Hemoglobin: 12.3 g/dL (ref 12.0–15.0)
Immature Granulocytes: 0 %
Lymphocytes Relative: 31 %
Lymphs Abs: 1.6 10*3/uL (ref 0.7–4.0)
MCH: 28.7 pg (ref 26.0–34.0)
MCHC: 31.9 g/dL (ref 30.0–36.0)
MCV: 90 fL (ref 80.0–100.0)
Monocytes Absolute: 0.5 10*3/uL (ref 0.1–1.0)
Monocytes Relative: 10 %
Neutro Abs: 2.9 10*3/uL (ref 1.7–7.7)
Neutrophils Relative %: 56 %
Platelet Count: 248 10*3/uL (ref 150–400)
RBC: 4.29 MIL/uL (ref 3.87–5.11)
RDW: 13.7 % (ref 11.5–15.5)
WBC Count: 5.1 10*3/uL (ref 4.0–10.5)
nRBC: 0 % (ref 0.0–0.2)

## 2020-09-02 LAB — CMP (CANCER CENTER ONLY)
ALT: 17 U/L (ref 0–44)
AST: 23 U/L (ref 15–41)
Albumin: 4 g/dL (ref 3.5–5.0)
Alkaline Phosphatase: 80 U/L (ref 38–126)
Anion gap: 8 (ref 5–15)
BUN: 15 mg/dL (ref 8–23)
CO2: 29 mmol/L (ref 22–32)
Calcium: 9.6 mg/dL (ref 8.9–10.3)
Chloride: 102 mmol/L (ref 98–111)
Creatinine: 0.93 mg/dL (ref 0.44–1.00)
GFR, Estimated: >60 mL/min (ref >60)
Glucose, Bld: 91 mg/dL (ref 70–99)
Potassium: 4 mmol/L (ref 3.5–5.1)
Sodium: 139 mmol/L (ref 135–145)
Total Bilirubin: 0.4 mg/dL (ref 0.3–1.2)
Total Protein: 7.6 g/dL (ref 6.5–8.1)

## 2020-09-02 LAB — LACTATE DEHYDROGENASE: LDH: 166 U/L (ref 98–192)

## 2020-09-02 NOTE — Telephone Encounter (Signed)
Appointments scheduled patient advised and has My Chart as well per 1/10 los

## 2020-09-02 NOTE — Progress Notes (Signed)
Referral MD  Reason for Referral: Elevated M spike-likely reactive MGUS  Chief Complaint  Patient presents with  . New Patient (Initial Visit)  : I was told that I had a abnormal protein.  HPI: Kaitlyn Hanna is a very charming 80 year old white female.  She comes in with her husband.  Things have 2 children, 3 grandchildren, and 4 great-grandchildren.  She is originally from [place].  She was recently found to have Sjogren's syndrome.  She had a fairly thorough work-up.  As part of the work-up, she had an SPEP done.  I guess this was because she had a elevated sed rate.  The SPEP, not surprisingly, showed that she had a minimal M spike of 0.3 g/dL.  This was not further worked up.  She was subsequently sent to the [facility] for an evaluation.  She has had no problems with infections.  There has been no issues with rashes.  She has had dry eyes.  She has had no fever.  She did have the COVID back in December.  She was not hospitalized.  She has a past history of Raynaud's phenomenon.  There is no obvious blood problems in the family.  She does not smoke.  She does not drink.  She did have other routine lab work done.  Everything else looked okay.  There has been no recent travel.  She and her husband do try to visit their family which for the most part lives in other parts of the country.  She has had no weight loss or weight gain.  She has had a hysterectomy in the past.  She is up-to-date with her mammograms and also with her colonoscopy.  Overall, I would say performance status is ECOG 0.    Past Medical History:  Diagnosis Date  . Benign schwannoma 06/30/2016  . H/O measles   . Hearing loss in right ear 04/14/2016  . Osteoporosis 04/14/2016  . Raynaud's phenomenon 04/14/2016  . Schwannoma of cranial nerve ([place]) 06/30/2016  :  Past Surgical History:  Procedure Laterality Date  . ABDOMINAL HYSTERECTOMY     age 13 TAH b/l SPO for endometriosis  .  TONSILLECTOMY    . WISDOM TOOTH EXTRACTION    :   Current Outpatient Medications:  Marland Kitchen  Multiple Vitamins-Minerals (MULTIVITAMIN WITH MINERALS) tablet, Take 1 tablet by mouth daily., Disp: , Rfl:  .  predniSONE (DELTASONE) 10 MG tablet, 2 tablets x 14 days, 1 tablet x14 days, Disp: , Rfl:  .  amoxicillin (AMOXIL) 500 MG tablet, Take 500 mg by mouth every 8 (eight) hours., Disp: , Rfl:  .  omega-3 fish oil (MAXEPA) 1000 MG CAPS capsule, 1 capsule, Disp: , Rfl:  .  predniSONE (DELTASONE) 10 MG tablet, Take by mouth., Disp: , Rfl:  .  Specialty Vitamins Products (ONE-A-DAY BONE STRENGTH PO), Take by mouth 3 (three) times daily., Disp: , Rfl: :  :  No Known Allergies:  Family History  Problem Relation Age of Onset  . Heart disease Mother        MI  . Cancer Father        lung cancer, small cell, smoker  . Hypertension Sister   . Dementia Sister   . Alcohol abuse Brother   . Cancer Maternal Grandmother   . Dementia Sister   . Arthritis Sister        Rheumtoid   . Alzheimer's disease Sister   . Cancer Brother   :  Social History  Socioeconomic History  . Marital status: Married    Spouse name: Not on file  . Number of children: Not on file  . Years of education: Not on file  . Highest education level: Not on file  Occupational History  . Not on file  Tobacco Use  . Smoking status: Never Smoker  . Smokeless tobacco: Never Used  Vaping Use  . Vaping Use: Never used  Substance and Sexual Activity  . Alcohol use: No  . Drug use: No  . Sexual activity: Yes  Other Topics Concern  . 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 Walks and Y deep water exercise   Raised 2 stepkids, son and daughter   Social Determinants of Health   Financial Resource Strain: Low Risk   . Difficulty of Paying Living Expenses: Not hard at all  Food Insecurity: Not on file   Transportation Needs: Not on file  Physical Activity: Not on file  Stress: Not on file  Social Connections: Not on file  Intimate Partner Violence: Not on file  : Review of Systems  Constitutional: Negative.   HENT: Negative.   Eyes: Negative.   Respiratory: Negative.   Cardiovascular: Negative.  Negative for chest pain.  Gastrointestinal: Negative.   Genitourinary: Negative.   Musculoskeletal: Negative.   Skin: Negative.   Neurological: Negative.   Endo/Heme/Allergies: Negative.   Psychiatric/Behavioral: Negative.      Exam:  This is a well-developed and well-nourished white female in no obvious distress.  Vital signs were temperature of 97.5.  Pulse 62.  Blood pressure 172/68.  Weight is 155 pounds.  Head and neck exam shows no ocular or oral lesions.  She has no scleral icterus.  There is no adenopathy in the neck.  Thyroid is nonpalpable.  Lungs are clear and to percussion and auscultation bilaterally.  Cardiac exam regular rate and rhythm with no murmurs, rubs or bruits.  Abdomen is soft.  She has good bowel sounds.  There is no fluid wave.  There is no palpable liver or spleen tip.  Back exam shows no tenderness over the spine, ribs or hips.  Neurological exam shows no focal neurological deficits.  Skin exam shows no rashes, ecchymoses or petechia.   @IPVITALS @   Recent Labs    09/02/20 1020  WBC 5.1  HGB 12.3  HCT 38.6  PLT 248   Recent Labs    09/02/20 1020  NA 139  K 4.0  CL 102  CO2 29  GLUCOSE 91  BUN 15  CREATININE 0.93  CALCIUM 9.6    Blood smear review: Normochromic and normocytic population of red blood cells.  There are no nucleated red blood cells.  There are no teardrop cells.  I see no rouleaux formation.  She has no schistocytes or spherocytes.  White blood cells appear normal in morphology and maturation.  I see no immature myeloid or lymphoid forms.  Platelets are adequate number and size.  Pathology: None    Assessment and Plan: [name] is a very nice 80 year old white female.  She has a minimal M spike.  I would to say this is probably an MGUS.  We will have to characterize it.  I would suspect this probably is going to be a IgG kappa MGUS.  I have to believe this is probably can be reactive to this collagen vascular  situation that she has with the Sjogren's.  I do not see anything that comes close to meeting criteria for myeloma.  I would like to think that she would never have a problem with this.  I suspect this is can be reactive and that she will always have this.  I think that we do have to follow her up.  I think that in this situation, a follow-up in 6 months would be reasonable.  She does not need a 24-hour urine.  She does not need a bone marrow biopsy done.  She does not need a bone survey done.  I spent about 45 minutes with she and her husband.  They have a strong faith.  I gave her a prayer blanket and she was very thankful for this.  I reassured her that she would be the healthiest person that I saw all day.

## 2020-09-03 ENCOUNTER — Encounter: Payer: Self-pay | Admitting: *Deleted

## 2020-09-03 LAB — KAPPA/LAMBDA LIGHT CHAINS
Kappa free light chain: 26.7 mg/L — ABNORMAL HIGH (ref 3.3–19.4)
Kappa, lambda light chain ratio: 1.25 (ref 0.26–1.65)
Lambda free light chains: 21.3 mg/L (ref 5.7–26.3)

## 2020-09-03 LAB — IGG, IGA, IGM
IgA: 612 mg/dL — ABNORMAL HIGH (ref 64–422)
IgG (Immunoglobin G), Serum: 1210 mg/dL (ref 586–1602)
IgM (Immunoglobulin M), Srm: 96 mg/dL (ref 26–217)

## 2020-09-03 LAB — BETA 2 MICROGLOBULIN, SERUM: Beta-2 Microglobulin: 2.1 mg/L (ref 0.6–2.4)

## 2020-09-03 NOTE — Progress Notes (Signed)
Navigator not in the office for new patient appointment.   Oncology Nurse Navigator Documentation  Oncology Nurse Navigator Flowsheets 09/03/2020  Abnormal Finding Date -  Diagnosis Status -  Navigator Follow Up Date: -  Navigator Follow Up Reason: -  Navigation Complete Date: 09/03/2020  Post Navigation: Continue to Follow Patient? No  Reason Not Navigating Patient: No Cancer Diagnosis  Navigator Location [facility]  Referral Date to RadOnc/MedOnc -  Navigator Encounter Type Appt/Treatment Plan Review  Patient Visit Type MedOnc  Treatment Phase Abnormal Labs  Barriers/Navigation Needs Anxiety;Coordination of Care;Education  Education -  Interventions None Required  Acuity Level 2-Minimal Needs (1-2 Barriers Identified)  Coordination of Care -  Education Method -  Support Groups/Services Friends and Family  Time Spent with Patient 15

## 2020-09-05 LAB — PROTEIN ELECTROPHORESIS, SERUM, WITH REFLEX
A/G Ratio: 1.1 (ref 0.7–1.7)
Albumin ELP: 3.8 g/dL (ref 2.9–4.4)
Alpha-1-Globulin: 0.2 g/dL (ref 0.0–0.4)
Alpha-2-Globulin: 0.8 g/dL (ref 0.4–1.0)
Beta Globulin: 1.2 g/dL (ref 0.7–1.3)
Gamma Globulin: 1.5 g/dL (ref 0.4–1.8)
Globulin, Total: 3.6 g/dL (ref 2.2–3.9)
M-Spike, %: 0.3 g/dL — ABNORMAL HIGH
SPEP Interpretation: 0
Total Protein ELP: 7.4 g/dL (ref 6.0–8.5)

## 2020-09-05 LAB — IMMUNOFIXATION REFLEX, SERUM
IgA: 663 mg/dL — ABNORMAL HIGH (ref 64–422)
IgG (Immunoglobin G), Serum: 1343 mg/dL (ref 586–1602)
IgM (Immunoglobulin M), Srm: 102 mg/dL (ref 26–217)

## 2020-09-06 ENCOUNTER — Telehealth: Payer: Self-pay | Admitting: *Deleted

## 2020-09-06 NOTE — Telephone Encounter (Signed)
Notified pt of results. No concerns at this time. 

## 2020-09-06 NOTE — Telephone Encounter (Signed)
-----   Message from Volanda Napoleon, MD sent at 09/05/2020  4:54 PM EST ----- Call - the abnormal protein is still low at 0.3.   this likely will never get worse!! Laurey Arrow

## 2020-09-19 ENCOUNTER — Telehealth: Payer: Self-pay | Admitting: Family Medicine

## 2020-09-19 NOTE — Telephone Encounter (Signed)
Spoke with patient she will call back   Called patient to schedule Annual Wellness Visit.  Please schedule with Health Nurse Advisor Augustine Radar. at [facility].

## 2020-09-26 DIAGNOSIS — H903 Sensorineural hearing loss, bilateral: Secondary | ICD-10-CM | POA: Diagnosis not present

## 2020-10-08 ENCOUNTER — Other Ambulatory Visit: Payer: Self-pay | Admitting: Otolaryngology

## 2020-10-08 DIAGNOSIS — D333 Benign neoplasm of cranial nerves: Secondary | ICD-10-CM

## 2020-10-24 ENCOUNTER — Ambulatory Visit (INDEPENDENT_AMBULATORY_CARE_PROVIDER_SITE_OTHER): Payer: Medicare Other

## 2020-10-24 ENCOUNTER — Other Ambulatory Visit: Payer: Self-pay

## 2020-10-24 VITALS — BP 126/80 | HR 81 | Temp 98.6°F | Resp 16 | Ht 60.4 in | Wt 152.2 lb

## 2020-10-24 DIAGNOSIS — Z0000 Encounter for general adult medical examination without abnormal findings: Secondary | ICD-10-CM | POA: Diagnosis not present

## 2020-10-24 NOTE — Patient Instructions (Signed)
[name] , Thank you for taking time to come for your Medicare Wellness Visit. I appreciate your ongoing commitment to your health goals. Please review the following plan we discussed and let me know if I can assist you in the future.   Screening recommendations/referrals: Colonoscopy: No longer required Mammogram: Per our conversation, completed in 2021 at GYN office. Please have a copy of results sent to PCP. Bone Density: Completed 12/21/2019-Due 12/20/2021 Recommended yearly ophthalmology/optometry visit for glaucoma screening and checkup Recommended yearly dental visit for hygiene and checkup  Vaccinations: Influenza vaccine: Up to date Pneumococcal vaccine: Completed vaccines Tdap vaccine: Up to date-Due 03/20/2014 Shingles vaccine: Discuss with pharmacy   Covid-19:Completed vaccines  Advanced directives: Copy in chart  Conditions/risks identified: See problem list  Next appointment: Follow up in one year for your annual wellness visit 10/30/2021 @ 1:20   Preventive Care 65 Years and Older, Female Preventive care refers to lifestyle choices and visits with your health care provider that can promote health and wellness. What does preventive care include?  A yearly physical exam. This is also called an annual well check.  Dental exams once or twice a year.  Routine eye exams. Ask your health care provider how often you should have your eyes checked.  Personal lifestyle choices, including:  Daily care of your teeth and gums.  Regular physical activity.  Eating a healthy diet.  Avoiding tobacco and drug use.  Limiting alcohol use.  Practicing safe sex.  Taking low-dose aspirin every day.  Taking vitamin and mineral supplements as recommended by your health care provider. What happens during an annual well check? The services and screenings done by your health care provider during your annual well check will depend on your age, overall health, lifestyle risk factors,  and family history of disease. Counseling  Your health care provider may ask you questions about your:  Alcohol use.  Tobacco use.  Drug use.  Emotional well-being.  Home and relationship well-being.  Sexual activity.  Eating habits.  History of falls.  Memory and ability to understand (cognition).  Work and work Statistician.  Reproductive health. Screening  You may have the following tests or measurements:  Height, weight, and BMI.  Blood pressure.  Lipid and cholesterol levels. These may be checked every 5 years, or more frequently if you are over 71 years old.  Skin check.  Lung cancer screening. You may have this screening every year starting at age 34 if you have a 30-pack-year history of smoking and currently smoke or have quit within the past 15 years.  Fecal occult blood test (FOBT) of the stool. You may have this test every year starting at age 16.  Flexible sigmoidoscopy or colonoscopy. You may have a sigmoidoscopy every 5 years or a colonoscopy every 10 years starting at age 46.  Hepatitis C blood test.  Hepatitis B blood test.  Sexually transmitted disease (STD) testing.  Diabetes screening. This is done by checking your blood sugar (glucose) after you have not eaten for a while (fasting). You may have this done every 1-3 years.  Bone density scan. This is done to screen for osteoporosis. You may have this done starting at age 53.  Mammogram. This may be done every 1-2 years. Talk to your health care provider about how often you should have regular mammograms. Talk with your health care provider about your test results, treatment options, and if necessary, the need for more tests. Vaccines  Your health care provider may recommend  certain vaccines, such as:  Influenza vaccine. This is recommended every year.  Tetanus, diphtheria, and acellular pertussis (Tdap, Td) vaccine. You may need a Td booster every 10 years.  Zoster vaccine. You may need  this after age 69.  Pneumococcal 13-valent conjugate (PCV13) vaccine. One dose is recommended after age 24.  Pneumococcal polysaccharide (PPSV23) vaccine. One dose is recommended after age 55. Talk to your health care provider about which screenings and vaccines you need and how often you need them. This information is not intended to replace advice given to you by your health care provider. Make sure you discuss any questions you have with your health care provider. Document Released: 09/06/2015 Document Revised: 04/29/2016 Document Reviewed: 06/11/2015 Elsevier Interactive Patient Education  2017 [place] Prevention in the Home Falls can cause injuries. They can happen to people of all ages. There are many things you can do to make your home safe and to help prevent falls. What can I do on the outside of my home?  Regularly fix the edges of walkways and driveways and fix any cracks.  Remove anything that might make you trip as you walk through a door, such as a raised step or threshold.  Trim any bushes or trees on the path to your home.  Use bright outdoor lighting.  Clear any walking paths of anything that might make someone trip, such as rocks or tools.  Regularly check to see if handrails are loose or broken. Make sure that both sides of any steps have handrails.  Any raised decks and porches should have guardrails on the edges.  Have any leaves, snow, or ice cleared regularly.  Use sand or salt on walking paths during winter.  Clean up any spills in your garage right away. This includes oil or grease spills. What can I do in the bathroom?  Use night lights.  Install grab bars by the toilet and in the tub and shower. Do not use towel bars as grab bars.  Use non-skid mats or decals in the tub or shower.  If you need to sit down in the shower, use a plastic, non-slip stool.  Keep the floor dry. Clean up any water that spills on the floor as soon as it  happens.  Remove soap buildup in the tub or shower regularly.  Attach bath mats securely with double-sided non-slip rug tape.  Do not have throw rugs and other things on the floor that can make you trip. What can I do in the bedroom?  Use night lights.  Make sure that you have a light by your bed that is easy to reach.  Do not use any sheets or blankets that are too big for your bed. They should not hang down onto the floor.  Have a firm chair that has side arms. You can use this for support while you get dressed.  Do not have throw rugs and other things on the floor that can make you trip. What can I do in the kitchen?  Clean up any spills right away.  Avoid walking on wet floors.  Keep items that you use a lot in easy-to-reach places.  If you need to reach something above you, use a strong step stool that has a grab bar.  Keep electrical cords out of the way.  Do not use floor polish or wax that makes floors slippery. If you must use wax, use non-skid floor wax.  Do not have throw rugs and other  things on the floor that can make you trip. What can I do with my stairs?  Do not leave any items on the stairs.  Make sure that there are handrails on both sides of the stairs and use them. Fix handrails that are broken or loose. Make sure that handrails are as long as the stairways.  Check any carpeting to make sure that it is firmly attached to the stairs. Fix any carpet that is loose or worn.  Avoid having throw rugs at the top or bottom of the stairs. If you do have throw rugs, attach them to the floor with carpet tape.  Make sure that you have a light switch at the top of the stairs and the bottom of the stairs. If you do not have them, ask someone to add them for you. What else can I do to help prevent falls?  Wear shoes that:  Do not have high heels.  Have rubber bottoms.  Are comfortable and fit you well.  Are closed at the toe. Do not wear sandals.  If you  use a stepladder:  Make sure that it is fully opened. Do not climb a closed stepladder.  Make sure that both sides of the stepladder are locked into place.  Ask someone to hold it for you, if possible.  Clearly mark and make sure that you can see:  Any grab bars or handrails.  First and last steps.  Where the edge of each step is.  Use tools that help you move around (mobility aids) if they are needed. These include:  Canes.  Walkers.  Scooters.  Crutches.  Turn on the lights when you go into a dark area. Replace any light bulbs as soon as they burn out.  Set up your furniture so you have a clear path. Avoid moving your furniture around.  If any of your floors are uneven, fix them.  If there are any pets around you, be aware of where they are.  Review your medicines with your doctor. Some medicines can make you feel dizzy. This can increase your chance of falling. Ask your doctor what other things that you can do to help prevent falls. This information is not intended to replace advice given to you by your health care provider. Make sure you discuss any questions you have with your health care provider. Document Released: 06/06/2009 Document Revised: 01/16/2016 Document Reviewed: 09/14/2014 Elsevier Interactive Patient Education  2017 [employer].

## 2020-10-24 NOTE — Progress Notes (Signed)
Subjective:   Kaitlyn Hanna is a 80 y.o. female who presents for Medicare Annual (Subsequent) preventive examination.  Review of Systems     Cardiac Risk Factors include: advanced age (>58men, >79 women);dyslipidemia     Objective:    Today's Vitals   10/24/20 1309  BP: 126/80  Pulse: 81  Resp: 16  Temp: 98.6 F (37 C)  TempSrc: Oral  SpO2: 97%  Weight: 152 lb 3.2 oz (69 kg)  Height: 5' 0.4" (1.534 m)   Body mass index is 29.33 kg/m.  Advanced Directives 10/24/2020 09/02/2020 09/08/2019 09/05/2018  Does Patient Have a Medical Advance Directive? Yes No Yes Yes  Type of Advance Directive Living will - (No Data) [place];Living will  Does patient want to make changes to medical advance directive? - - No - Patient declined No - Patient declined  Copy of [place] in Chart? - - - No - copy requested  Would patient like information on creating a medical advance directive? - No - Patient declined - -    Current Medications (verified) Outpatient Encounter Medications as of 10/24/2020  Medication Sig  . Multiple Vitamins-Minerals (MULTIVITAMIN WITH MINERALS) tablet Take 1 tablet by mouth daily.  Marland Kitchen Specialty Vitamins Products (ONE-A-DAY BONE STRENGTH PO) Take by mouth 3 (three) times daily.  . [DISCONTINUED] amoxicillin (AMOXIL) 500 MG tablet Take 500 mg by mouth every 8 (eight) hours.  . [DISCONTINUED] omega-3 fish oil (MAXEPA) 1000 MG CAPS capsule 1 capsule  . [DISCONTINUED] predniSONE (DELTASONE) 10 MG tablet 2 tablets x 14 days, 1 tablet x14 days  . [DISCONTINUED] predniSONE (DELTASONE) 10 MG tablet Take by mouth.   No facility-administered encounter medications on file as of 10/24/2020.    Allergies (verified) Patient has no known allergies.   History: Past Medical History:  Diagnosis Date  . Benign schwannoma 06/30/2016  . H/O measles   . Hearing loss in right ear 04/14/2016  . Osteoporosis 04/14/2016  . Raynaud's phenomenon 04/14/2016   . Schwannoma of cranial nerve ([place]) 06/30/2016   Past Surgical History:  Procedure Laterality Date  . ABDOMINAL HYSTERECTOMY     age 42 TAH b/l SPO for endometriosis  . TONSILLECTOMY    . WISDOM TOOTH EXTRACTION     Family History  Problem Relation Age of Onset  . Heart disease Mother        MI  . Cancer Father        lung cancer, small cell, smoker  . Hypertension Sister   . Dementia Sister   . Alcohol abuse Brother   . Cancer Maternal Grandmother   . Dementia Sister   . Arthritis Sister        Rheumtoid   . Alzheimer's disease Sister   . Cancer Brother    Social History   Socioeconomic History  . Marital status: Married    Spouse name: Not on file  . Number of children: Not on file  . Years of education: Not on file  . Highest education level: Not on file  Occupational History  . Not on file  Tobacco Use  . Smoking status: Never Smoker  . Smokeless tobacco: Never Used  Vaping Use  . Vaping Use: Never used  Substance and Sexual Activity  . Alcohol use: No  . Drug use: No  . Sexual activity: Yes  Other Topics Concern  . Not on file  Social History Narrative   Lives with husband   Retired from medical office work  with [employer]   No major dietary restrictions   Exercises daily. Walks and Y deep water exercise   Raised 2 stepkids, son and daughter   Social Determinants of Health   Financial Resource Strain: Low Risk   . Difficulty of Paying Living Expenses: Not hard at all  Food Insecurity: No Food Insecurity  . Worried About Charity fundraiser in the Last Year: Never true  . Ran Out of Food in the Last Year: Never true  Transportation Needs: No Transportation Needs  . Lack of Transportation (Medical): No  . Lack of Transportation (Non-Medical): No  Physical Activity: Insufficiently Active  . Days of Exercise per Week: 3 days  . Minutes of Exercise per Session: 20 min  Stress: No Stress Concern Present  . Feeling of Stress : Not at all  Social  Connections: Moderately Integrated  . Frequency of Communication with Friends and Family: More than three times a week  . Frequency of Social Gatherings with Friends and Family: More than three times a week  . Attends Religious Services: More than 4 times per year  . Active Member of Clubs or Organizations: No  . Attends Archivist Meetings: Never  . Marital Status: Married    Tobacco Counseling Counseling given: Not Answered   Clinical Intake:  Pre-visit preparation completed: Yes  Pain : No/denies pain     Nutritional Status: BMI 25 -29 Overweight Nutritional Risks: None Diabetes: No  How often do you need to have someone help you when you read instructions, pamphlets, or other written materials from your doctor or pharmacy?: 1 - Never  Diabetic?No  Interpreter Needed?: No  Information entered by :: Kaitlyn Hamman LPN   Activities of Daily Living In your present state of health, do you have any difficulty performing the following activities: 10/24/2020  Hearing? N  Vision? N  Difficulty concentrating or making decisions? N  Walking or climbing stairs? N  Dressing or bathing? N  Doing errands, shopping? N  Preparing Food and eating ? N  Using the Toilet? N  In the past six months, have you accidently leaked urine? N  Do you have problems with loss of bowel control? N  Managing your Medications? N  Managing your Finances? N  Housekeeping or managing your Housekeeping? N  Some recent data might be hidden    Patient Care Team: Kaitlyn Lukes, MD as PCP - General (Family Medicine) Kaitlyn Olp Rudell Cobb, MD as Consulting Physician (Oncology)  Indicate any recent Medical Services you may have received from other than [facility] providers in the past year (date may be approximate).     Assessment:   This is a routine wellness examination for Kaitlyn Hanna.  Hearing/Vision screen  Hearing Screening   125Hz  250Hz  500Hz  1000Hz  2000Hz  3000Hz  4000Hz  6000Hz  8000Hz   Right  ear:           Left ear:           Comments: Hearing aid-right ear  Vision Screening Comments: Wears glasses Last eye exam-2020  Dietary issues and exercise activities discussed: Current Exercise Habits: Home exercise routine, Type of exercise: walking, Time (Minutes): 25, Frequency (Times/Week): 3, Weekly Exercise (Minutes/Week): 75, Intensity: Mild, Exercise limited by: None identified  Goals    . Increase physical activity     Walk 3x per week-2 miles    . Patient Stated     Would like to lose 20 pounds      Depression Screen [facility] 2/9 Scores 10/24/2020 09/08/2019 09/05/2018  04/14/2016  PHQ - 2 Score 0 0 0 0    Fall Risk Fall Risk  10/24/2020 09/08/2019 09/05/2018 04/14/2016  Falls in the past year? 0 0 0 No  Number falls in past yr: 0 0 - -  Injury with Fall? 0 0 - -  Follow up Falls prevention discussed Education provided;Falls prevention discussed - -    FALL RISK PREVENTION PERTAINING TO THE HOME:  Any stairs in or around the home? No  Home free of loose throw rugs in walkways, pet beds, electrical cords, etc? Yes  Adequate lighting in your home to reduce risk of falls? Yes   ASSISTIVE DEVICES UTILIZED TO PREVENT FALLS:  Life alert? No  Use of a cane, walker or w/c? No  Grab bars in the bathroom? Yes  Shower chair or bench in shower? No  Elevated toilet seat or a handicapped toilet? No   TIMED UP AND GO:  Was the test performed? Yes .  Length of time to ambulate 10 feet: 10 sec.   Gait steady and fast without use of assistive device  Cognitive Function:Normal cognitive status assessed by direct observation by this Nurse Health Advisor. No abnormalities found.          Immunizations Immunization History  Administered Date(s) Administered  . Influenza, High Dose Seasonal PF 05/29/2017  . Influenza-Unspecified 06/22/2016, 05/24/2018, 05/24/2020  . PFIZER(Purple Top)SARS-COV-2 Vaccination 09/12/2019, 10/02/2019, 07/23/2020  . Pneumococcal Conjugate-13 09/10/2014   . Pneumococcal Polysaccharide-23 06/14/2008  . Tdap 03/20/2014    TDAP status: Due, Education has been provided regarding the importance of this vaccine. Advised may receive this vaccine at local pharmacy or Health Dept. Aware to provide a copy of the vaccination record if obtained from local pharmacy or Health Dept. Verbalized acceptance and understanding.  Flu Vaccine status: Up to date  Pneumococcal vaccine status: Up to date  Covid-19 vaccine status: Completed vaccines  Qualifies for Shingles Vaccine? Yes   Zostavax completed No   Shingrix Completed?: No.    Education has been provided regarding the importance of this vaccine. Patient has been advised to call insurance company to determine out of pocket expense if they have not yet received this vaccine. Advised may also receive vaccine at local pharmacy or Health Dept. Verbalized acceptance and understanding.  Screening Tests Health Maintenance  Topic Date Due  . Hepatitis C Screening  Never done  . COVID-19 Vaccine (4 - Booster for [place] series) 01/20/2021  . TETANUS/TDAP  03/20/2024  . INFLUENZA VACCINE  Completed  . DEXA SCAN  Completed  . PNA vac Low Risk Adult  Completed  . HPV VACCINES  Aged Out    Health Maintenance  Health Maintenance Due  Topic Date Due  . Hepatitis C Screening  Never done    Colorectal cancer screening: No longer required.   Mammogram status: Completed Bilateral 2021. Repeat every year  Per patient completed at GYN office. Requested copy of results be sent to PCP.  Bone Density status: Completed 12/21/2019. Results reflect: Bone density results: OSTEOPOROSIS. Repeat every 2 years.  Lung Cancer Screening: (Low Dose CT Chest recommended if Age 34-80 years, 30 pack-year currently smoking OR have quit w/in 15years.) does not qualify.    Additional Screening:  Hepatitis C Screening: does not qualify  Vision Screening: Recommended annual ophthalmology exams for early detection of glaucoma  and other disorders of the eye. Is the patient up to date with their annual eye exam?  No  Who is the provider or what is  the name of the office in which the patient attends annual eye exams? Unsure of name Patient plans to make an appt soon.  Dental Screening: Recommended annual dental exams for proper oral hygiene  Community Resource Referral / Chronic Care Management: CRR required this visit?  No   CCM required this visit?  No      Plan:     I have personally reviewed and noted the following in the patient's chart:   . Medical and social history . Use of alcohol, tobacco or illicit drugs  . Current medications and supplements . Functional ability and status . Nutritional status . Physical activity . Advanced directives . List of other physicians . Hospitalizations, surgeries, and ER visits in previous 12 months . Vitals . Screenings to include cognitive, depression, and falls . Referrals and appointments  In addition, I have reviewed and discussed with patient certain preventive protocols, quality metrics, and best practice recommendations. A written personalized care plan for preventive services as well as general preventive health recommendations were provided to patient.   Patient would like to access avs on mychart.  Marta Antu, LPN   12/29/8467  Nurse Health Advisor  Nurse Notes: None

## 2020-10-26 ENCOUNTER — Ambulatory Visit
Admission: RE | Admit: 2020-10-26 | Discharge: 2020-10-26 | Disposition: A | Discharge status: Home / Self Care | Payer: Medicare Other | Source: Ambulatory Visit | Attending: Otolaryngology | Admitting: Otolaryngology

## 2020-10-26 ENCOUNTER — Other Ambulatory Visit: Payer: Self-pay

## 2020-10-26 DIAGNOSIS — D333 Benign neoplasm of cranial nerves: Secondary | ICD-10-CM | POA: Diagnosis not present

## 2020-10-26 MED ORDER — GADOBENATE DIMEGLUMINE 529 MG/ML IV SOLN
14.0000 mL | Freq: Once | INTRAVENOUS | Status: AC | PRN
  Administered 2020-10-26: 14 mL via INTRAVENOUS

## 2020-10-29 DIAGNOSIS — H9041 Sensorineural hearing loss, unilateral, right ear, with unrestricted hearing on the contralateral side: Secondary | ICD-10-CM | POA: Diagnosis not present

## 2020-10-29 DIAGNOSIS — D333 Benign neoplasm of cranial nerves: Secondary | ICD-10-CM | POA: Diagnosis not present

## 2020-10-29 DIAGNOSIS — D37030 Neoplasm of uncertain behavior of the parotid salivary glands: Secondary | ICD-10-CM | POA: Diagnosis not present

## 2020-12-23 ENCOUNTER — Ambulatory Visit (INDEPENDENT_AMBULATORY_CARE_PROVIDER_SITE_OTHER): Payer: Medicare Other | Admitting: Family Medicine

## 2020-12-23 ENCOUNTER — Encounter: Payer: Self-pay | Admitting: Family Medicine

## 2020-12-23 ENCOUNTER — Other Ambulatory Visit: Payer: Self-pay

## 2020-12-23 VITALS — BP 106/70 | HR 63 | Temp 98.3°F | Resp 16 | Ht 60.0 in | Wt 150.0 lb

## 2020-12-23 DIAGNOSIS — M810 Age-related osteoporosis without current pathological fracture: Secondary | ICD-10-CM | POA: Diagnosis not present

## 2020-12-23 DIAGNOSIS — R7989 Other specified abnormal findings of blood chemistry: Secondary | ICD-10-CM

## 2020-12-23 DIAGNOSIS — M3501 Sicca syndrome with keratoconjunctivitis: Secondary | ICD-10-CM | POA: Diagnosis not present

## 2020-12-23 DIAGNOSIS — R0600 Dyspnea, unspecified: Secondary | ICD-10-CM | POA: Diagnosis not present

## 2020-12-23 DIAGNOSIS — M170 Bilateral primary osteoarthritis of knee: Secondary | ICD-10-CM | POA: Diagnosis not present

## 2020-12-23 DIAGNOSIS — M3500 Sicca syndrome, unspecified: Secondary | ICD-10-CM | POA: Insufficient documentation

## 2020-12-23 DIAGNOSIS — Z0000 Encounter for general adult medical examination without abnormal findings: Secondary | ICD-10-CM

## 2020-12-23 DIAGNOSIS — E782 Mixed hyperlipidemia: Secondary | ICD-10-CM

## 2020-12-23 DIAGNOSIS — H9191 Unspecified hearing loss, right ear: Secondary | ICD-10-CM

## 2020-12-23 LAB — COMPREHENSIVE METABOLIC PANEL
ALT: 13 U/L (ref 0–35)
AST: 19 U/L (ref 0–37)
Albumin: 4.2 g/dL (ref 3.5–5.2)
Alkaline Phosphatase: 82 U/L (ref 39–117)
BUN: 18 mg/dL (ref 6–23)
CO2: 28 mEq/L (ref 19–32)
Calcium: 9.2 mg/dL (ref 8.4–10.5)
Chloride: 103 mEq/L (ref 96–112)
Creatinine, Ser: 0.93 mg/dL (ref 0.40–1.20)
GFR: 59.1 mL/min — ABNORMAL LOW (ref >60.00)
Glucose, Bld: 91 mg/dL (ref 70–99)
Potassium: 4.8 mEq/L (ref 3.5–5.1)
Sodium: 139 mEq/L (ref 135–145)
Total Bilirubin: 0.4 mg/dL (ref 0.2–1.2)
Total Protein: 7.3 g/dL (ref 6.0–8.3)

## 2020-12-23 LAB — CBC WITH DIFFERENTIAL/PLATELET
Basophils Absolute: 0.1 10*3/uL (ref 0.0–0.1)
Basophils Relative: 1.1 % (ref 0.0–3.0)
Eosinophils Absolute: 0.1 10*3/uL (ref 0.0–0.7)
Eosinophils Relative: 0.8 % (ref 0.0–5.0)
HCT: 37.2 % (ref 36.0–46.0)
Hemoglobin: 12.4 g/dL (ref 12.0–15.0)
Lymphocytes Relative: 23.7 % (ref 12.0–46.0)
Lymphs Abs: 1.4 10*3/uL (ref 0.7–4.0)
MCHC: 33.4 g/dL (ref 30.0–36.0)
MCV: 87.6 fl (ref 78.0–100.0)
Monocytes Absolute: 0.5 10*3/uL (ref 0.1–1.0)
Monocytes Relative: 8.8 % (ref 3.0–12.0)
Neutro Abs: 4 10*3/uL (ref 1.4–7.7)
Neutrophils Relative %: 65.6 % (ref 43.0–77.0)
Platelets: 236 10*3/uL (ref 150.0–400.0)
RBC: 4.25 Mil/uL (ref 3.87–5.11)
RDW: 14.1 % (ref 11.5–15.5)
WBC: 6.1 10*3/uL (ref 4.0–10.5)

## 2020-12-23 LAB — TSH: TSH: 3.62 u[IU]/mL (ref 0.35–4.50)

## 2020-12-23 LAB — LIPID PANEL
Cholesterol: 231 mg/dL — ABNORMAL HIGH (ref 0–200)
HDL: 41 mg/dL (ref >39.00)
LDL Cholesterol: 155 mg/dL — ABNORMAL HIGH (ref 0–99)
NonHDL: 190.35
Total CHOL/HDL Ratio: 6
Triglycerides: 176 mg/dL — ABNORMAL HIGH (ref 0.0–149.0)
VLDL: 35.2 mg/dL (ref 0.0–40.0)

## 2020-12-23 LAB — T4, FREE: Free T4: 0.75 ng/dL (ref 0.60–1.60)

## 2020-12-23 LAB — HEMOGLOBIN A1C: Hgb A1c MFr Bld: 6 % (ref 4.6–6.5)

## 2020-12-23 NOTE — Assessment & Plan Note (Signed)
Encouraged to get adequate exercise, calcium and vitamin d intake 

## 2020-12-23 NOTE — Progress Notes (Signed)
Subjective:    Patient ID: Kaitlyn Hanna, female    DOB: 1941/08/24, 80 y.o.   MRN: [id number]  Chief Complaint  Patient presents with  . Annual Exam    Pt has no concerns or problem.    HPI Patient is in today for annual preventative exam and follow up on chronic medical concerns including osteoporosis, hyperlipidemia and more. She feels well today. She has had a few new concerns this year. She has been diagnosed with Sjogren's due to dry eyes and mouth and is managing symptoms. She had a strange spot appear on her left facial cheek and she was seen by dermatology and ENT but it resolved apparently spontaneously. Her knees have seen stiffening and can be tougher after prolonged stiffening. She still follows with GYN for paps and mammograms and has had no trouble there. Denies CP/palp/SOB/HA/congestion/fevers/GI or GU c/o. Taking meds as prescribed. She works hard to stay active and tries to maintain a heart healthy diet.  Past Medical History:  Diagnosis Date  . Benign schwannoma 06/30/2016  . H/O measles   . Hearing loss in right ear 04/14/2016  . Osteoporosis 04/14/2016  . Raynaud's phenomenon 04/14/2016  . Schwannoma of cranial nerve ([place]) 06/30/2016    Past Surgical History:  Procedure Laterality Date  . ABDOMINAL HYSTERECTOMY     age 71 TAH b/l SPO for endometriosis  . TONSILLECTOMY    . WISDOM TOOTH EXTRACTION      Family History  Problem Relation Age of Onset  . Heart disease Mother        MI  . Cancer Father        lung cancer, small cell, smoker  . Hypertension Sister   . Dementia Sister   . Alcohol abuse Brother   . Cancer Maternal Grandmother   . Dementia Sister   . Arthritis Sister        Rheumtoid   . Alzheimer's disease Sister   . Cancer Brother     Social History   Socioeconomic History  . Marital status: Married    Spouse name: Not on file  . Number of children: Not on file  . Years of education: Not on file  . Highest education level: Not on file   Occupational History  . Not on file  Tobacco Use  . Smoking status: Never Smoker  . Smokeless tobacco: Never Used  Vaping Use  . Vaping Use: Never used  Substance and Sexual Activity  . Alcohol use: No  . Drug use: No  . Sexual activity: Yes  Other Topics Concern  . 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 Walks and Y deep water exercise   Raised 2 stepkids, son and daughter   Social Determinants of Health   Financial Resource Strain: Low Risk   . Difficulty of Paying Living Expenses: Not hard at all  Food Insecurity: No Food Insecurity  . Worried About Charity fundraiser in the Last Year: Never true  . Ran Out of Food in the Last Year: Never true  Transportation Needs: No Transportation Needs  . Lack of Transportation (Medical): No  . Lack of Transportation (Non-Medical): No  Physical Activity: Insufficiently Active  . Days of Exercise per Week: 3 days  . Minutes of Exercise per Session: 20 min  Stress: No Stress Concern Present  . Feeling of Stress :  Not at all  Social Connections: Moderately Integrated  . Frequency of Communication with Friends and Family: More than three times a week  . Frequency of Social Gatherings with Friends and Family: More than three times a week  . Attends Religious Services: More than 4 times per year  . Active Member of Clubs or Organizations: No  . Attends Archivist Meetings: Never  . Marital Status: Married  Human resources officer Violence: Not At Risk  . Fear of Current or Ex-Partner: No  . Emotionally Abused: No  . Physically Abused: No  . Sexually Abused: No    Outpatient Medications Prior to Visit  Medication Sig Dispense Refill  . Multiple Vitamins-Minerals (MULTIVITAMIN WITH MINERALS) tablet Take 1 tablet by mouth daily.    Marland Kitchen Specialty Vitamins Products (ONE-A-DAY BONE STRENGTH PO) Take by mouth 3 (three)  times daily.     No facility-administered medications prior to visit.    No Known Allergies  Review of Systems  Constitutional: Negative for chills, fever and malaise/fatigue.  HENT: Positive for hearing loss. Negative for congestion.   Eyes: Negative for discharge.  Respiratory: Negative for cough, sputum production and shortness of breath.   Cardiovascular: Negative for chest pain, palpitations and leg swelling.  Gastrointestinal: Negative for abdominal pain, blood in stool, constipation, diarrhea, heartburn, nausea and vomiting.  Genitourinary: Negative for dysuria, frequency, hematuria and urgency.  Musculoskeletal: Positive for joint pain. Negative for back pain, falls and myalgias.  Skin: Negative for rash.  Neurological: Negative for dizziness, sensory change, loss of consciousness, weakness and headaches.  Endo/Heme/Allergies: Negative for environmental allergies. Does not bruise/bleed easily.  Psychiatric/Behavioral: Negative for depression and suicidal ideas. The patient is not nervous/anxious and does not have insomnia.        Objective:    Physical Exam Constitutional:      General: She is not in acute distress.    Appearance: She is not diaphoretic.  HENT:     Head: Normocephalic and atraumatic.     Right Ear: External ear normal.     Left Ear: External ear normal.     Nose: Nose normal.     Mouth/Throat:     Pharynx: No oropharyngeal exudate.  Eyes:     General: No scleral icterus.       Right eye: No discharge.        Left eye: No discharge.     Conjunctiva/sclera: Conjunctivae normal.     Pupils: Pupils are equal, round, and reactive to light.  Neck:     Thyroid: No thyromegaly.  Cardiovascular:     Rate and Rhythm: Normal rate and regular rhythm.     Heart sounds: Normal heart sounds. No murmur heard.   Pulmonary:     Effort: Pulmonary effort is normal. No respiratory distress.     Breath sounds: Normal breath sounds. No wheezing or rales.   Abdominal:     General: Bowel sounds are normal. There is no distension.     Palpations: Abdomen is soft. There is no mass.     Tenderness: There is no abdominal tenderness.  Musculoskeletal:        General: No tenderness. Normal range of motion.     Cervical back: Normal range of motion and neck supple.  Lymphadenopathy:     Cervical: No cervical adenopathy.  Skin:    General: Skin is warm and dry.     Findings: No rash.  Neurological:     Mental Status: She is alert and oriented  to person, place, and time.     Cranial Nerves: No cranial nerve deficit.     Coordination: Coordination normal.     Deep Tendon Reflexes: Reflexes are normal and symmetric. Reflexes normal.     BP 106/70   Pulse 63   Temp 98.3 F (36.8 C)   Resp 16   Ht 5' (1.524 m)   Wt 150 lb (68 kg)   LMP  (LMP Unknown)   SpO2 90%   BMI 29.29 kg/m  Wt Readings from Last 3 Encounters:  12/23/20 150 lb (68 kg)  10/24/20 152 lb 3.2 oz (69 kg)  09/02/20 155 lb 8 oz (70.5 kg)    Diabetic Foot Exam - Simple   No data filed    Lab Results  Component Value Date   WBC 5.1 09/02/2020   HGB 12.3 09/02/2020   HCT 38.6 09/02/2020   PLT 248 09/02/2020   GLUCOSE 91 09/02/2020   CHOL 220 (H) 12/21/2019   TRIG 133.0 12/21/2019   HDL 42.60 12/21/2019   LDLCALC 151 (H) 12/21/2019   ALT 17 09/02/2020   AST 23 09/02/2020   NA 139 09/02/2020   K 4.0 09/02/2020   CL 102 09/02/2020   CREATININE 0.93 09/02/2020   BUN 15 09/02/2020   CO2 29 09/02/2020   TSH 5.08 (H) 12/21/2019    Lab Results  Component Value Date   TSH 5.08 (H) 12/21/2019   Lab Results  Component Value Date   WBC 5.1 09/02/2020   HGB 12.3 09/02/2020   HCT 38.6 09/02/2020   MCV 90.0 09/02/2020   PLT 248 09/02/2020   Lab Results  Component Value Date   NA 139 09/02/2020   K 4.0 09/02/2020   CO2 29 09/02/2020   GLUCOSE 91 09/02/2020   BUN 15 09/02/2020   CREATININE 0.93 09/02/2020   BILITOT 0.4 09/02/2020   ALKPHOS 80 09/02/2020    AST 23 09/02/2020   ALT 17 09/02/2020   PROT 7.6 09/02/2020   ALBUMIN 4.0 09/02/2020   CALCIUM 9.6 09/02/2020   ANIONGAP 8 09/02/2020   GFR 57.05 (L) 12/21/2019   Lab Results  Component Value Date   CHOL 220 (H) 12/21/2019   Lab Results  Component Value Date   HDL 42.60 12/21/2019   Lab Results  Component Value Date   LDLCALC 151 (H) 12/21/2019   Lab Results  Component Value Date   TRIG 133.0 12/21/2019   Lab Results  Component Value Date   CHOLHDL 5 12/21/2019   No results found for: HGBA1C     Assessment & Plan:   Problem List Items Addressed This Visit    Hearing loss in right ear    She uses a hearing aide in right ear and she has been told she may need an aide in the left ear soon      Hyperlipidemia, mixed   Relevant Orders   CBC with Differential/Platelet   Comprehensive metabolic panel   Lipid panel   Osteoporosis    Encouraged to get adequate exercise, calcium and vitamin d intake      Relevant Orders   Vitamin D 1,25 dihydroxy   Preventative health care - Primary    Patient encouraged to maintain heart healthy diet, regular exercise, adequate sleep. Consider daily probiotics. Take medications as prescribed. Labs ordered and reviewed. She follows with Dr Ulanda Edison of GYN for paps. Last colonoscopy 2009, she declines repeat at this time. She is encouraged to do a Cologuard but she is hesiitant.  She is encouraged to consider the Shingrix shots and a second Covid booster at 6 + months from last shot. Requesting MGM and pap from GYN and dexa scan April 2021 due next year.       Relevant Orders   Hemoglobin A1c   CBC with Differential/Platelet   Comprehensive metabolic panel   Lipid panel   TSH   Vitamin D 1,25 dihydroxy   Sjogren's disease ([place])    Diagnosed after experiencing dry eyes was worked up with labwork done by Rheumatology. She is noting eye and mouth symptoms. She is trying to stay hydrated and treat symptoms.       Osteoarthritis of  both knees    Some stiffness and discomfort after prolonged sitting but nothing that limits activity. Encouraged to stay as active as tolerated and consider fatty acid supplement and multivitamin with minerals.       Other Visit Diagnoses    Elevated TSH       Relevant Orders   T4, free   Dyspnea, unspecified type       Relevant Orders   ECHOCARDIOGRAM COMPLETE      I am having Kaitlyn Hanna maintain her multivitamin with minerals and Specialty Vitamins Products (ONE-A-DAY BONE STRENGTH PO).  No orders of the defined types were placed in this encounter.    Penni Homans, MD

## 2020-12-23 NOTE — Assessment & Plan Note (Signed)
She uses a hearing aide in right ear and she has been told she may need an aide in the left ear soon

## 2020-12-23 NOTE — Assessment & Plan Note (Signed)
Diagnosed after experiencing dry eyes was worked up with labwork done by Rheumatology. She is noting eye and mouth symptoms. She is trying to stay hydrated and treat symptoms.

## 2020-12-23 NOTE — Assessment & Plan Note (Addendum)
Patient encouraged to maintain heart healthy diet, regular exercise, adequate sleep. Consider daily probiotics. Take medications as prescribed. Labs ordered and reviewed. She follows with Dr Ulanda Edison of GYN for paps. Last colonoscopy 2009, she declines repeat at this time. She is encouraged to do a Cologuard but she is hesiitant. She is encouraged to consider the Shingrix shots and a second Covid booster at 6 + months from last shot. Requesting MGM and pap from GYN and dexa scan April 2021 due next year.

## 2020-12-23 NOTE — Patient Instructions (Addendum)
Multivitamin with minerals, fatty acid such as fish or krill or flaxseed oil daily, vitamin D 2000 IU daily, probiotic daily  Consider Cologuard to rule out colon cancer the result is good,   Shingrix is the new shingles shot, 2 shots over 2-6 months, confirm coverage with insurance and document, then can return here for shots with nurse appt or at pharmacy  Consider Hepatitis C blood work with next visit to rule out latent infection per CDC guidelines Preventive Care 78 Years and Older, Female Preventive care refers to lifestyle choices and visits with your health care provider that can promote health and wellness. This includes:  A yearly physical exam. This is also called an annual wellness visit.  Regular dental and eye exams.  Immunizations.  Screening for certain conditions.  Healthy lifestyle choices, such as: ? Eating a healthy diet. ? Getting regular exercise. ? Not using drugs or products that contain nicotine and tobacco. ? Limiting alcohol use. What can I expect for my preventive care visit? Physical exam Your health care provider will check your:  Height and weight. These may be used to calculate your BMI (body mass index). BMI is a measurement that tells if you are at a healthy weight.  Heart rate and blood pressure.  Body temperature.  Skin for abnormal spots. Counseling Your health care provider may ask you questions about your:  Past medical problems.  Family's medical history.  Alcohol, tobacco, and drug use.  Emotional well-being.  Home life and relationship well-being.  Sexual activity.  Diet, exercise, and sleep habits.  History of falls.  Memory and ability to understand (cognition).  Work and work Statistician.  Pregnancy and menstrual history.  Access to firearms. What immunizations do I need? Vaccines are usually given at various ages, according to a schedule. Your health care provider will recommend vaccines for you based on your  age, medical history, and lifestyle or other factors, such as travel or where you work.   What tests do I need? Blood tests  Lipid and cholesterol levels. These may be checked every 5 years, or more often depending on your overall health.  Hepatitis C test.  Hepatitis B test. Screening  Lung cancer screening. You may have this screening every year starting at age 76 if you have a 30-pack-year history of smoking and currently smoke or have quit within the past 15 years.  Colorectal cancer screening. ? All adults should have this screening starting at age 32 and continuing until age 62. ? Your health care provider may recommend screening at age 73 if you are at increased risk. ? You will have tests every 1-10 years, depending on your results and the type of screening test.  Diabetes screening. ? This is done by checking your blood sugar (glucose) after you have not eaten for a while (fasting). ? You may have this done every 1-3 years.  Mammogram. ? This may be done every 1-2 years. ? Talk with your health care provider about how often you should have regular mammograms.  Abdominal aortic aneurysm (AAA) screening. You may need this if you are a current or former smoker.  BRCA-related cancer screening. This may be done if you have a family history of breast, ovarian, tubal, or peritoneal cancers. Other tests  STD (sexually transmitted disease) testing, if you are at risk.  Bone density scan. This is done to screen for osteoporosis. You may have this done starting at age 20. Talk with your health care provider about your  test results, treatment options, and if necessary, the need for more tests. Follow these instructions at home: Eating and drinking  Eat a diet that includes fresh fruits and vegetables, whole grains, lean protein, and low-fat dairy products. Limit your intake of foods with high amounts of sugar, saturated fats, and salt.  Take vitamin and mineral supplements as  recommended by your health care provider.  Do not drink alcohol if your health care provider tells you not to drink.  If you drink alcohol: ? Limit how much you have to 0-1 drink a day. ? Be aware of how much alcohol is in your drink. In the [place] one drink equals one 12 oz bottle of beer (355 mL), one 5 oz glass of wine (148 mL), or one 1 oz glass of hard liquor (44 mL).   Lifestyle  Take daily care of your teeth and gums. Brush your teeth every morning and night with fluoride toothpaste. Floss one time each day.  Stay active. Exercise for at least 30 minutes 5 or more days each week.  Do not use any products that contain nicotine or tobacco, such as cigarettes, e-cigarettes, and chewing tobacco. If you need help quitting, ask your health care provider.  Do not use drugs.  If you are sexually active, practice safe sex. Use a condom or other form of protection in order to prevent STIs (sexually transmitted infections).  Talk with your health care provider about taking a low-dose aspirin or statin.  Find healthy ways to cope with stress, such as: ? Meditation, yoga, or listening to music. ? Journaling. ? Talking to a trusted person. ? Spending time with friends and family. Safety  Always wear your seat belt while driving or riding in a vehicle.  Do not drive: ? If you have been drinking alcohol. Do not ride with someone who has been drinking. ? When you are tired or distracted. ? While texting.  Wear a helmet and other protective equipment during sports activities.  If you have firearms in your house, make sure you follow all gun safety procedures. What's next?  Visit your health care provider once a year for an annual wellness visit.  Ask your health care provider how often you should have your eyes and teeth checked.  Stay up to date on all vaccines. This information is not intended to replace advice given to you by your health care provider. Make sure you discuss any  questions you have with your health care provider. Document Revised: 07/31/2020 Document Reviewed: 08/04/2018 Elsevier Patient Education  2021 [employer].

## 2020-12-23 NOTE — Assessment & Plan Note (Signed)
Some stiffness and discomfort after prolonged sitting but nothing that limits activity. Encouraged to stay as active as tolerated and consider fatty acid supplement and multivitamin with minerals.

## 2020-12-24 ENCOUNTER — Encounter: Payer: Self-pay | Admitting: *Deleted

## 2020-12-26 LAB — VITAMIN D 1,25 DIHYDROXY
Vitamin D 1, 25 (OH)2 Total: 35 pg/mL (ref 18–72)
Vitamin D2 1, 25 (OH)2: <8 pg/mL
Vitamin D3 1, 25 (OH)2: 35 pg/mL

## 2020-12-27 ENCOUNTER — Other Ambulatory Visit: Payer: Self-pay

## 2020-12-27 DIAGNOSIS — E782 Mixed hyperlipidemia: Secondary | ICD-10-CM

## 2021-02-04 ENCOUNTER — Ambulatory Visit (HOSPITAL_COMMUNITY)
Admission: RE | Admit: 2021-02-04 | Discharge: 2021-02-04 | Disposition: A | Discharge status: Home / Self Care | Payer: Medicare Other | Source: Ambulatory Visit | Attending: Family Medicine | Admitting: Family Medicine

## 2021-02-04 ENCOUNTER — Other Ambulatory Visit: Payer: Self-pay

## 2021-02-04 DIAGNOSIS — R0602 Shortness of breath: Secondary | ICD-10-CM | POA: Insufficient documentation

## 2021-02-04 DIAGNOSIS — R0600 Dyspnea, unspecified: Secondary | ICD-10-CM

## 2021-02-04 DIAGNOSIS — R0609 Other forms of dyspnea: Secondary | ICD-10-CM

## 2021-02-04 LAB — ECHOCARDIOGRAM COMPLETE
AR max vel: 2.46 cm2
AV Area VTI: 2.24 cm2
AV Area mean vel: 2.31 cm2
AV Mean grad: 4 mmHg
AV Peak grad: 8.2 mmHg
Ao pk vel: 1.43 m/s
Area-P 1/2: 3.6 cm2
S' Lateral: 2.5 cm

## 2021-02-04 NOTE — Progress Notes (Signed)
  Echocardiogram 2D Echocardiogram has been performed.  Kaitlyn Hanna F 02/04/2021, 2:15 PM

## 2021-02-19 ENCOUNTER — Other Ambulatory Visit: Payer: Self-pay | Admitting: Otolaryngology

## 2021-02-19 DIAGNOSIS — D333 Benign neoplasm of cranial nerves: Secondary | ICD-10-CM

## 2021-03-03 ENCOUNTER — Inpatient Hospital Stay: Payer: Medicare Other | Attending: Hematology & Oncology

## 2021-03-03 ENCOUNTER — Telehealth: Payer: Self-pay

## 2021-03-03 ENCOUNTER — Inpatient Hospital Stay: Payer: Medicare Other | Admitting: Hematology & Oncology

## 2021-03-03 ENCOUNTER — Encounter: Payer: Self-pay | Admitting: Hematology & Oncology

## 2021-03-03 ENCOUNTER — Other Ambulatory Visit: Payer: Self-pay

## 2021-03-03 VITALS — BP 139/70 | HR 55 | Temp 98.1°F | Resp 20 | Wt 147.8 lb

## 2021-03-03 DIAGNOSIS — D472 Monoclonal gammopathy: Secondary | ICD-10-CM | POA: Diagnosis not present

## 2021-03-03 DIAGNOSIS — Z7189 Other specified counseling: Secondary | ICD-10-CM

## 2021-03-03 DIAGNOSIS — R0602 Shortness of breath: Secondary | ICD-10-CM | POA: Insufficient documentation

## 2021-03-03 DIAGNOSIS — M3504 Sicca syndrome with tubulo-interstitial nephropathy: Secondary | ICD-10-CM

## 2021-03-03 HISTORY — DX: Other specified counseling: Z71.89

## 2021-03-03 HISTORY — DX: Monoclonal gammopathy: D47.2

## 2021-03-03 LAB — CMP (CANCER CENTER ONLY)
ALT: 13 U/L (ref 0–44)
AST: 20 U/L (ref 15–41)
Albumin: 4.1 g/dL (ref 3.5–5.0)
Alkaline Phosphatase: 113 U/L (ref 38–126)
Anion gap: 5 (ref 5–15)
BUN: 18 mg/dL (ref 8–23)
CO2: 29 mmol/L (ref 22–32)
Calcium: 9.2 mg/dL (ref 8.9–10.3)
Chloride: 102 mmol/L (ref 98–111)
Creatinine: 1 mg/dL (ref 0.44–1.00)
GFR, Estimated: 58 mL/min — ABNORMAL LOW (ref >60)
Glucose, Bld: 92 mg/dL (ref 70–99)
Potassium: 4.1 mmol/L (ref 3.5–5.1)
Sodium: 136 mmol/L (ref 135–145)
Total Bilirubin: 0.3 mg/dL (ref 0.3–1.2)
Total Protein: 7 g/dL (ref 6.5–8.1)

## 2021-03-03 LAB — CBC WITH DIFFERENTIAL (CANCER CENTER ONLY)
Abs Immature Granulocytes: 0.01 10*3/uL (ref 0.00–0.07)
Basophils Absolute: 0.1 10*3/uL (ref 0.0–0.1)
Basophils Relative: 1 %
Eosinophils Absolute: 0.1 10*3/uL (ref 0.0–0.5)
Eosinophils Relative: 1 %
HCT: 37.7 % (ref 36.0–46.0)
Hemoglobin: 11.9 g/dL — ABNORMAL LOW (ref 12.0–15.0)
Immature Granulocytes: 0 %
Lymphocytes Relative: 33 %
Lymphs Abs: 1.9 10*3/uL (ref 0.7–4.0)
MCH: 29 pg (ref 26.0–34.0)
MCHC: 31.6 g/dL (ref 30.0–36.0)
MCV: 91.7 fL (ref 80.0–100.0)
Monocytes Absolute: 0.6 10*3/uL (ref 0.1–1.0)
Monocytes Relative: 10 %
Neutro Abs: 3.2 10*3/uL (ref 1.7–7.7)
Neutrophils Relative %: 55 %
Platelet Count: 254 10*3/uL (ref 150–400)
RBC: 4.11 MIL/uL (ref 3.87–5.11)
RDW: 13.8 % (ref 11.5–15.5)
WBC Count: 5.8 10*3/uL (ref 4.0–10.5)
nRBC: 0 % (ref 0.0–0.2)

## 2021-03-03 LAB — LACTATE DEHYDROGENASE: LDH: 157 U/L (ref 98–192)

## 2021-03-03 NOTE — Progress Notes (Signed)
Hematology and Oncology Follow Up Visit  Kaitlyn Hanna [id number] 1941-02-04 80 y.o. 03/03/2021   Principle Diagnosis:  IgA Kappa MGUS  Current Therapy:   Observation     Interim History:  Kaitlyn Hanna is back for follow-up.  This is her second office visit.  We first saw her back in January.  Since then, she been doing pretty well.  When we saw her, we did do a myeloma work-up.  Her SPEP did show an M spike of 0.3 g/dL.  She does have an IgA kappa monoclonal gammopathy.  Her IgG a level was 663 mg/dL.  Her kappa light chain was 2.7 mg/dL.  She is feeling well.  She has had no problems with cough or shortness of breath.  She comes in with her husband.  They are both very tanned.  They have a pool and they have a garden.  Likes to be outside.  She has had no problems with the Sjogren's..  She had echocardiogram done recently.  This was for some shortness of breath.  Thankfully, the echocardiogram did not show anything significant.  Her left ventricular ejection fraction was 60-65%.  She had pretty much normal valvular structure.  She has had no change in bowel or bladder habits.  She has had no rashes.  There has been no infections.  She has had no bleeding.  Overall, her performance status is ECOG 1.  Medications:  Current Outpatient Medications:    Flaxseed, Linseed, (FLAXSEED OIL PO), Take by mouth daily., Disp: , Rfl:    Multiple Vitamins-Minerals (MULTIVITAMIN WITH MINERALS) tablet, Take 1 tablet by mouth daily., Disp: , Rfl:    Multiple Vitamins-Minerals (ZINC PO), Take by mouth., Disp: , Rfl:    Omega-3 Fatty Acids (FISH OIL PO), Take by mouth daily., Disp: , Rfl:   Allergies: No Known Allergies  Past Medical History, Surgical history, Social history, and Family History were reviewed and updated.  Review of Systems: Review of Systems  Constitutional: Negative.   HENT:  Negative.    Eyes: Negative.   Respiratory: Negative.    Cardiovascular: Negative.  Negative for  palpitations.  Gastrointestinal: Negative.   Endocrine: Negative.   Genitourinary: Negative.    Musculoskeletal: Negative.   Skin: Negative.   Neurological: Negative.   Hematological: Negative.   Psychiatric/Behavioral: Negative.     Physical Exam:  weight is 147 lb 12.8 oz (67 kg). Her oral temperature is 98.1 F (36.7 C). Her blood pressure is 139/70 and her pulse is 55 (abnormal). Her respiration is 20 and oxygen saturation is 93%.   Wt Readings from Last 3 Encounters:  03/03/21 147 lb 12.8 oz (67 kg)  12/23/20 150 lb (68 kg)  10/24/20 152 lb 3.2 oz (69 kg)    Physical Exam Vitals reviewed.  HENT:     Head: Normocephalic and atraumatic.  Eyes:     Pupils: Pupils are equal, round, and reactive to light.  Cardiovascular:     Rate and Rhythm: Normal rate and regular rhythm.     Heart sounds: Normal heart sounds.  Pulmonary:     Effort: Pulmonary effort is normal.     Breath sounds: Normal breath sounds.  Abdominal:     General: Bowel sounds are normal.     Palpations: Abdomen is soft.  Musculoskeletal:        General: No tenderness or deformity. Normal range of motion.     Cervical back: Normal range of motion.  Lymphadenopathy:     Cervical: No  cervical adenopathy.  Skin:    General: Skin is warm and dry.     Findings: No erythema or rash.  Neurological:     Mental Status: She is alert and oriented to person, place, and time.  Psychiatric:        Behavior: Behavior normal.        Thought Content: Thought content normal.        Judgment: Judgment normal.     Lab Results  Component Value Date   WBC 5.8 03/03/2021   HGB 11.9 (L) 03/03/2021   HCT 37.7 03/03/2021   MCV 91.7 03/03/2021   PLT 254 03/03/2021     Chemistry      Component Value Date/Time   NA 136 03/03/2021 1009   K 4.1 03/03/2021 1009   CL 102 03/03/2021 1009   CO2 29 03/03/2021 1009   BUN 18 03/03/2021 1009   CREATININE 1.00 03/03/2021 1009      Component Value Date/Time   CALCIUM 9.2  03/03/2021 1009   ALKPHOS 113 03/03/2021 1009   AST 20 03/03/2021 1009   ALT 13 03/03/2021 1009   BILITOT 0.3 03/03/2021 1009      Impression and Plan: Kaitlyn Hanna is a very charming 80 year old white female.  She looks quite younger.  She has an IgA kappa MGUS.  I am sure this is reactive.  We will have to see what her monoclonal studies look like today.  Her total protein is same so I would not think that she would have an increase in her M spike.  The fact that this is IgA certainly could indicate that there may be a high risk of progressing.  I still do not see that we have to do any other studies on her.  I do not see where a 24-hour urine would help [place] out.  She clearly does not need a bone marrow test.  I still feel comfortable follow her every 6 months.  We will get her through the holiday season in the fall.   Volanda Napoleon, MD 7/11/202211:37 AM

## 2021-03-04 LAB — BETA 2 MICROGLOBULIN, SERUM: Beta-2 Microglobulin: 2.3 mg/L (ref 0.6–2.4)

## 2021-03-04 LAB — IGG, IGA, IGM
IgA: 572 mg/dL — ABNORMAL HIGH (ref 64–422)
IgG (Immunoglobin G), Serum: 1184 mg/dL (ref 586–1602)
IgM (Immunoglobulin M), Srm: 96 mg/dL (ref 26–217)

## 2021-03-04 LAB — KAPPA/LAMBDA LIGHT CHAINS
Kappa free light chain: 29.8 mg/L — ABNORMAL HIGH (ref 3.3–19.4)
Kappa, lambda light chain ratio: 1.21 (ref 0.26–1.65)
Lambda free light chains: 24.6 mg/L (ref 5.7–26.3)

## 2021-03-06 LAB — IMMUNOFIXATION REFLEX, SERUM
IgA: 617 mg/dL — ABNORMAL HIGH (ref 64–422)
IgG (Immunoglobin G), Serum: 1297 mg/dL (ref 586–1602)
IgM (Immunoglobulin M), Srm: 100 mg/dL (ref 26–217)

## 2021-03-06 LAB — PROTEIN ELECTROPHORESIS, SERUM, WITH REFLEX
A/G Ratio: 1.1 (ref 0.7–1.7)
Albumin ELP: 3.7 g/dL (ref 2.9–4.4)
Alpha-1-Globulin: 0.2 g/dL (ref 0.0–0.4)
Alpha-2-Globulin: 0.7 g/dL (ref 0.4–1.0)
Beta Globulin: 1.5 g/dL — ABNORMAL HIGH (ref 0.7–1.3)
Gamma Globulin: 0.9 g/dL (ref 0.4–1.8)
Globulin, Total: 3.4 g/dL (ref 2.2–3.9)
M-Spike, %: 0.2 g/dL — ABNORMAL HIGH
SPEP Interpretation: 0
Total Protein ELP: 7.1 g/dL (ref 6.0–8.5)

## 2021-03-10 ENCOUNTER — Encounter: Payer: Self-pay | Admitting: *Deleted

## 2021-04-15 LAB — HEMOGLOBIN A1C: Hemoglobin A1C: 5.5

## 2021-04-21 ENCOUNTER — Telehealth: Payer: Self-pay | Admitting: Family Medicine

## 2021-04-21 NOTE — Telephone Encounter (Signed)
Patient has and absses in her mouth and got prescribed antibiotics for it by her dentist. Today she woke up with that side of her face puffy and red, she would like to know if she needs to come in and see her pcp for it or her dentist. Please advice.

## 2021-04-22 NOTE — Telephone Encounter (Signed)
Yes, I called her yesterday and advised her to see her dentist after speaking to you.

## 2021-05-01 DIAGNOSIS — H524 Presbyopia: Secondary | ICD-10-CM | POA: Diagnosis not present

## 2021-05-01 DIAGNOSIS — H43813 Vitreous degeneration, bilateral: Secondary | ICD-10-CM | POA: Diagnosis not present

## 2021-05-01 DIAGNOSIS — H5203 Hypermetropia, bilateral: Secondary | ICD-10-CM | POA: Diagnosis not present

## 2021-05-01 DIAGNOSIS — H04123 Dry eye syndrome of bilateral lacrimal glands: Secondary | ICD-10-CM | POA: Diagnosis not present

## 2021-05-01 DIAGNOSIS — H52223 Regular astigmatism, bilateral: Secondary | ICD-10-CM | POA: Diagnosis not present

## 2021-05-07 DIAGNOSIS — Z1231 Encounter for screening mammogram for malignant neoplasm of breast: Secondary | ICD-10-CM | POA: Diagnosis not present

## 2021-05-14 ENCOUNTER — Encounter: Payer: Self-pay | Admitting: *Deleted

## 2021-06-26 ENCOUNTER — Telehealth: Payer: Self-pay | Admitting: *Deleted

## 2021-06-26 NOTE — Telephone Encounter (Signed)
Pt has lab appointment Tuesday morning. Scheduling notes say comp and lipid but future orders are for comp and CBC.  Please verify if lipid panel is needed at this time and if so, please place future order.

## 2021-06-27 NOTE — Addendum Note (Signed)
Addended by: Randolm Idol A on: 06/27/2021 11:47 AM   Modules accepted: Orders

## 2021-06-27 NOTE — Telephone Encounter (Signed)
fixed

## 2021-07-01 ENCOUNTER — Other Ambulatory Visit: Payer: Self-pay

## 2021-07-01 ENCOUNTER — Other Ambulatory Visit (INDEPENDENT_AMBULATORY_CARE_PROVIDER_SITE_OTHER): Payer: Medicare Other

## 2021-07-01 DIAGNOSIS — E782 Mixed hyperlipidemia: Secondary | ICD-10-CM | POA: Diagnosis not present

## 2021-07-01 LAB — COMPREHENSIVE METABOLIC PANEL
ALT: 13 U/L (ref 0–35)
AST: 23 U/L (ref 0–37)
Albumin: 4.3 g/dL (ref 3.5–5.2)
Alkaline Phosphatase: 86 U/L (ref 39–117)
BUN: 18 mg/dL (ref 6–23)
CO2: 26 mEq/L (ref 19–32)
Calcium: 9.4 mg/dL (ref 8.4–10.5)
Chloride: 100 mEq/L (ref 96–112)
Creatinine, Ser: 1 mg/dL (ref 0.40–1.20)
GFR: 53.97 mL/min — ABNORMAL LOW (ref >60.00)
Glucose, Bld: 95 mg/dL (ref 70–99)
Potassium: 4.5 mEq/L (ref 3.5–5.1)
Sodium: 136 mEq/L (ref 135–145)
Total Bilirubin: 0.6 mg/dL (ref 0.2–1.2)
Total Protein: 7.5 g/dL (ref 6.0–8.3)

## 2021-07-01 LAB — LIPID PANEL
Cholesterol: 235 mg/dL — ABNORMAL HIGH (ref 0–200)
HDL: 45.4 mg/dL (ref >39.00)
LDL Cholesterol: 175 mg/dL — ABNORMAL HIGH (ref 0–99)
NonHDL: 189.19
Total CHOL/HDL Ratio: 5
Triglycerides: 70 mg/dL (ref 0.0–149.0)
VLDL: 14 mg/dL (ref 0.0–40.0)

## 2021-09-05 ENCOUNTER — Encounter: Payer: Self-pay | Admitting: Hematology & Oncology

## 2021-09-05 ENCOUNTER — Inpatient Hospital Stay: Payer: Medicare Other | Attending: Hematology & Oncology

## 2021-09-05 ENCOUNTER — Other Ambulatory Visit: Payer: Self-pay

## 2021-09-05 ENCOUNTER — Inpatient Hospital Stay: Payer: Medicare Other | Admitting: Hematology & Oncology

## 2021-09-05 VITALS — BP 111/87 | HR 60 | Temp 97.9°F | Resp 18 | Ht 60.0 in | Wt 132.0 lb

## 2021-09-05 DIAGNOSIS — D472 Monoclonal gammopathy: Secondary | ICD-10-CM | POA: Insufficient documentation

## 2021-09-05 DIAGNOSIS — M3500 Sicca syndrome, unspecified: Secondary | ICD-10-CM | POA: Insufficient documentation

## 2021-09-05 DIAGNOSIS — M3504 Sicca syndrome with tubulo-interstitial nephropathy: Secondary | ICD-10-CM

## 2021-09-05 LAB — CBC WITH DIFFERENTIAL (CANCER CENTER ONLY)
Abs Immature Granulocytes: 0.03 10*3/uL (ref 0.00–0.07)
Basophils Absolute: 0.1 10*3/uL (ref 0.0–0.1)
Basophils Relative: 1 %
Eosinophils Absolute: 0.1 10*3/uL (ref 0.0–0.5)
Eosinophils Relative: 2 %
HCT: 39.7 % (ref 36.0–46.0)
Hemoglobin: 12.7 g/dL (ref 12.0–15.0)
Immature Granulocytes: 0 %
Lymphocytes Relative: 28 %
Lymphs Abs: 2 10*3/uL (ref 0.7–4.0)
MCH: 28.7 pg (ref 26.0–34.0)
MCHC: 32 g/dL (ref 30.0–36.0)
MCV: 89.8 fL (ref 80.0–100.0)
Monocytes Absolute: 0.5 10*3/uL (ref 0.1–1.0)
Monocytes Relative: 7 %
Neutro Abs: 4.5 10*3/uL (ref 1.7–7.7)
Neutrophils Relative %: 62 %
Platelet Count: 277 10*3/uL (ref 150–400)
RBC: 4.42 MIL/uL (ref 3.87–5.11)
RDW: 14.1 % (ref 11.5–15.5)
WBC Count: 7.3 10*3/uL (ref 4.0–10.5)
nRBC: 0 % (ref 0.0–0.2)

## 2021-09-05 LAB — CMP (CANCER CENTER ONLY)
ALT: 10 U/L (ref 0–44)
AST: 18 U/L (ref 15–41)
Albumin: 4.5 g/dL (ref 3.5–5.0)
Alkaline Phosphatase: 96 U/L (ref 38–126)
Anion gap: 8 (ref 5–15)
BUN: 18 mg/dL (ref 8–23)
CO2: 30 mmol/L (ref 22–32)
Calcium: 10.1 mg/dL (ref 8.9–10.3)
Chloride: 101 mmol/L (ref 98–111)
Creatinine: 0.92 mg/dL (ref 0.44–1.00)
GFR, Estimated: >60 mL/min (ref >60)
Glucose, Bld: 95 mg/dL (ref 70–99)
Potassium: 3.9 mmol/L (ref 3.5–5.1)
Sodium: 139 mmol/L (ref 135–145)
Total Bilirubin: 0.5 mg/dL (ref 0.3–1.2)
Total Protein: 7.6 g/dL (ref 6.5–8.1)

## 2021-09-05 LAB — LACTATE DEHYDROGENASE: LDH: 167 U/L (ref 98–192)

## 2021-09-05 NOTE — Progress Notes (Signed)
Hematology and Oncology Follow Up Visit  Kaitlyn Hanna [id number] Dec 12, 1940 81 y.o. 09/05/2021   Principle Diagnosis:  IgA Kappa MGUS  Current Therapy:   Observation     Interim History:  Kaitlyn Hanna is back for follow-up.  We saw her 6 months ago.  Since then, she been doing pretty well.  She does have Sjogren's.  She has been seeing a rheumatologist.  When we saw her back in July, her M spike was only 0.2 g/dL.  The IgA level was 600 mg/dL.  The Kappa light chain was 3 mg/dL.  She has had no problems with infections.  She has had no issues with nausea or vomiting.  She has had no change in bowel or bladder habits.  She and her family had a nice holiday season.  She has had no rashes.  There is been no leg swelling.  Overall, her performance status is ECOG 1.  .  Medications:  Current Outpatient Medications:    Flaxseed, Linseed, (FLAXSEED OIL PO), Take by mouth daily., Disp: , Rfl:    Multiple Vitamins-Minerals (MULTIVITAMIN WITH MINERALS) tablet, Take 1 tablet by mouth daily., Disp: , Rfl:    Multiple Vitamins-Minerals (ZINC PO), Take by mouth., Disp: , Rfl:    Omega-3 Fatty Acids (FISH OIL PO), Take by mouth daily., Disp: , Rfl:   Allergies: No Known Allergies  Past Medical History, Surgical history, Social history, and Family History were reviewed and updated.  Review of Systems: Review of Systems  Constitutional: Negative.   HENT:  Negative.    Eyes: Negative.   Respiratory: Negative.    Cardiovascular: Negative.  Negative for palpitations.  Gastrointestinal: Negative.   Endocrine: Negative.   Genitourinary: Negative.    Musculoskeletal: Negative.   Skin: Negative.   Neurological: Negative.   Hematological: Negative.   Psychiatric/Behavioral: Negative.     Physical Exam:  height is 5' (1.524 m) and weight is 132 lb (59.9 kg). Her oral temperature is 97.9 F (36.6 C). Her blood pressure is 111/87 and her pulse is 60. Her respiration is 18 and oxygen saturation  is 100%.   Wt Readings from Last 3 Encounters:  09/05/21 132 lb (59.9 kg)  04/15/21 150 lb (68 kg)  03/03/21 147 lb 12.8 oz (67 kg)    Physical Exam Vitals reviewed.  HENT:     Head: Normocephalic and atraumatic.  Eyes:     Pupils: Pupils are equal, round, and reactive to light.  Cardiovascular:     Rate and Rhythm: Normal rate and regular rhythm.     Heart sounds: Normal heart sounds.  Pulmonary:     Effort: Pulmonary effort is normal.     Breath sounds: Normal breath sounds.  Abdominal:     General: Bowel sounds are normal.     Palpations: Abdomen is soft.  Musculoskeletal:        General: No tenderness or deformity. Normal range of motion.     Cervical back: Normal range of motion.  Lymphadenopathy:     Cervical: No cervical adenopathy.  Skin:    General: Skin is warm and dry.     Findings: No erythema or rash.  Neurological:     Mental Status: She is alert and oriented to person, place, and time.  Psychiatric:        Behavior: Behavior normal.        Thought Content: Thought content normal.        Judgment: Judgment normal.     Lab Results  Component Value Date   WBC 7.3 09/05/2021   HGB 12.7 09/05/2021   HCT 39.7 09/05/2021   MCV 89.8 09/05/2021   PLT 277 09/05/2021     Chemistry      Component Value Date/Time   NA 139 09/05/2021 1012   K 3.9 09/05/2021 1012   CL 101 09/05/2021 1012   CO2 30 09/05/2021 1012   BUN 18 09/05/2021 1012   CREATININE 0.92 09/05/2021 1012      Component Value Date/Time   CALCIUM 10.1 09/05/2021 1012   ALKPHOS 96 09/05/2021 1012   AST 18 09/05/2021 1012   ALT 10 09/05/2021 1012   BILITOT 0.5 09/05/2021 1012      Impression and Plan: Kaitlyn Hanna is a very charming 81 year old white female.  She looks quite younger.  She has an IgA kappa MGUS.  We will have to see what the monoclonal studies look like.  Hopefully everything is holding stable.  Hopefully, this is all secondary to the Sjogren's.  We will plan for another  follow-up in 6 months.  If everything looks stable, we might move everything up to a year.    Volanda Napoleon, MD 1/13/202310:59 AM

## 2021-09-06 LAB — IGG, IGA, IGM
IgA: 650 mg/dL — ABNORMAL HIGH (ref 64–422)
IgG (Immunoglobin G), Serum: 1243 mg/dL (ref 586–1602)
IgM (Immunoglobulin M), Srm: 107 mg/dL (ref 26–217)

## 2021-09-06 LAB — BETA 2 MICROGLOBULIN, SERUM: Beta-2 Microglobulin: 2.3 mg/L (ref 0.6–2.4)

## 2021-09-08 LAB — KAPPA/LAMBDA LIGHT CHAINS
Kappa free light chain: 35.5 mg/L — ABNORMAL HIGH (ref 3.3–19.4)
Kappa, lambda light chain ratio: 1.27 (ref 0.26–1.65)
Lambda free light chains: 28 mg/L — ABNORMAL HIGH (ref 5.7–26.3)

## 2021-09-09 LAB — PROTEIN ELECTROPHORESIS, SERUM, WITH REFLEX
A/G Ratio: 0.9 (ref 0.7–1.7)
Albumin ELP: 3.6 g/dL (ref 2.9–4.4)
Alpha-1-Globulin: 0.3 g/dL (ref 0.0–0.4)
Alpha-2-Globulin: 0.9 g/dL (ref 0.4–1.0)
Beta Globulin: 1.2 g/dL (ref 0.7–1.3)
Gamma Globulin: 1.6 g/dL (ref 0.4–1.8)
Globulin, Total: 3.9 g/dL (ref 2.2–3.9)
M-Spike, %: 0.4 g/dL — ABNORMAL HIGH
SPEP Interpretation: 0
Total Protein ELP: 7.5 g/dL (ref 6.0–8.5)

## 2021-09-09 LAB — IMMUNOFIXATION REFLEX, SERUM
IgA: 746 mg/dL — ABNORMAL HIGH (ref 64–422)
IgG (Immunoglobin G), Serum: 1525 mg/dL (ref 586–1602)
IgM (Immunoglobulin M), Srm: 123 mg/dL (ref 26–217)

## 2021-09-10 ENCOUNTER — Telehealth: Payer: Self-pay

## 2021-09-10 NOTE — Telephone Encounter (Signed)
Advised pt via MyChart.

## 2021-09-10 NOTE — Telephone Encounter (Signed)
-----   Message from Volanda Napoleon, MD sent at 09/09/2021  8:42 PM EST ----- Call - the abnormal protein is slowly going up.  It is still very low -- we do not need to treat right now, but we may need to treat in the future.  Laurey Arrow

## 2021-10-13 DIAGNOSIS — H905 Unspecified sensorineural hearing loss: Secondary | ICD-10-CM | POA: Diagnosis not present

## 2021-10-30 ENCOUNTER — Ambulatory Visit: Payer: Medicare Other

## 2021-11-19 ENCOUNTER — Ambulatory Visit (INDEPENDENT_AMBULATORY_CARE_PROVIDER_SITE_OTHER): Payer: Medicare Other | Admitting: Family Medicine

## 2021-11-19 ENCOUNTER — Encounter: Payer: Self-pay | Admitting: Family Medicine

## 2021-11-19 VITALS — BP 108/70 | HR 68 | Temp 97.9°F | Ht 60.0 in | Wt 129.2 lb

## 2021-11-19 DIAGNOSIS — J029 Acute pharyngitis, unspecified: Secondary | ICD-10-CM | POA: Diagnosis not present

## 2021-11-19 DIAGNOSIS — R220 Localized swelling, mass and lump, head: Secondary | ICD-10-CM | POA: Diagnosis not present

## 2021-11-19 DIAGNOSIS — R221 Localized swelling, mass and lump, neck: Secondary | ICD-10-CM | POA: Diagnosis not present

## 2021-11-19 LAB — POCT RAPID STREP A (OFFICE): Rapid Strep A Screen: NEGATIVE

## 2021-11-19 MED ORDER — PREDNISONE 20 MG PO TABS: 40.0000 mg | ORAL_TABLET | Freq: Every day | ORAL | 0 refills | Status: AC

## 2021-11-19 NOTE — Patient Instructions (Signed)
Consider throat lozenges, salt water gargles and an air humidifier for symptomatic care.   OK to take Tylenol 1000 mg (2 extra strength tabs) or 975 mg (3 regular strength tabs) every 6 hours as needed.  Let us know if you need anything.  

## 2021-11-19 NOTE — Progress Notes (Signed)
Chief Complaint  ?Patient presents with  ? Hoarse  ?  5 days ?Throat soreness ? ?  ? ? ?Kaitlyn Hanna here for URI complaints. ? ?Duration: 1 week  ?Associated symptoms: sore throat, dry cough (resolving), and fatigue ?Denies: sinus congestion, sinus pain, rhinorrhea, itchy watery eyes, ear pain, ear drainage, wheezing, shortness of breath, myalgia, and fevers ?Treatment to date: Advil, cough syrup, salt water gargles ?Sick contacts: No ? ?Past Medical History:  ?Diagnosis Date  ? Benign schwannoma 06/30/2016  ? Goals of care, counseling/discussion 03/03/2021  ? H/O measles   ? Hearing loss in right ear 04/14/2016  ? MGUS (monoclonal gammopathy of unknown significance) 03/03/2021  ? Osteoporosis 04/14/2016  ? Raynaud's phenomenon 04/14/2016  ? Schwannoma of cranial nerve ([place]) 06/30/2016  ? ? ?Objective ?BP 108/70   Pulse 68   Temp 97.9 ?F (36.6 ?C) (Oral)   Ht 5' (1.524 m)   Wt 129 lb 4 oz (58.6 kg)   LMP  (LMP Unknown)   SpO2 99%   BMI 25.24 kg/m?  ?General: Awake, alert, appears stated age ?HEENT: AT, [place], ears patent b/l and TM's neg, nares patent w/o discharge, pharynx pink and without exudates, MMM ?Neck: Submandibular glands are slightly tender to palpation and enlarged, more prominently on the left; there is no erythema or fluctuance ?Heart: RRR ?Lungs: CTAB, no accessory muscle use ?Psych: Age appropriate judgment and insight, normal mood and affect ? ?Sore throat - Plan: predniSONE (DELTASONE) 20 MG tablet, POCT rapid strep A ? ?Submandibular swelling - Plan: predniSONE (DELTASONE) 20 MG tablet, POCT rapid strep A ? ?Rapid strep test is negative.  5-day prednisone burst 40 mg daily for glandular swelling and overall erythema.  Continue to push fluids, practice good hand hygiene, cover mouth when/if coughing. ?F/u prn. If starting to experience fevers, shaking, or shortness of breath, seek immediate care. ?Pt voiced understanding and agreement to the plan. ? ?Shelda Pal, DO ?11/19/21 ?4:29 PM ? ?

## 2021-11-25 ENCOUNTER — Telehealth: Payer: Self-pay | Admitting: Family Medicine

## 2021-11-25 NOTE — Telephone Encounter (Signed)
Spoke to patient to schedule Medicare Annual Wellness Visit (AWV) either virtually/phone ? ?Pt declined stating no necessary  insurance came to home 2 weeks ago ? ? ?Last AWV ;10/23/20 ? please schedule at anytime with health coach ? ?.  ?

## 2022-03-06 ENCOUNTER — Ambulatory Visit: Payer: Medicare Other | Admitting: Hematology & Oncology

## 2022-03-06 ENCOUNTER — Inpatient Hospital Stay: Payer: Medicare Other

## 2022-03-13 ENCOUNTER — Ambulatory Visit: Payer: Medicare Other | Admitting: Hematology & Oncology

## 2022-03-13 ENCOUNTER — Inpatient Hospital Stay: Payer: Medicare Other

## 2022-03-23 ENCOUNTER — Encounter: Payer: Medicare Other | Admitting: Family Medicine

## 2022-03-23 ENCOUNTER — Other Ambulatory Visit: Payer: Self-pay

## 2022-03-23 ENCOUNTER — Inpatient Hospital Stay: Payer: Medicare Other | Admitting: Hematology & Oncology

## 2022-03-23 ENCOUNTER — Encounter: Payer: Self-pay | Admitting: Hematology & Oncology

## 2022-03-23 ENCOUNTER — Inpatient Hospital Stay: Payer: Medicare Other | Attending: Hematology & Oncology

## 2022-03-23 VITALS — BP 112/70 | HR 64 | Temp 98.0°F | Resp 18 | Ht 60.0 in | Wt 126.1 lb

## 2022-03-23 DIAGNOSIS — E7849 Other hyperlipidemia: Secondary | ICD-10-CM

## 2022-03-23 DIAGNOSIS — M3500 Sicca syndrome, unspecified: Secondary | ICD-10-CM | POA: Insufficient documentation

## 2022-03-23 DIAGNOSIS — D472 Monoclonal gammopathy: Secondary | ICD-10-CM

## 2022-03-23 LAB — CBC WITH DIFFERENTIAL (CANCER CENTER ONLY)
Abs Immature Granulocytes: 0.02 10*3/uL (ref 0.00–0.07)
Basophils Absolute: 0 10*3/uL (ref 0.0–0.1)
Basophils Relative: 1 %
Eosinophils Absolute: 0.1 10*3/uL (ref 0.0–0.5)
Eosinophils Relative: 1 %
HCT: 38.4 % (ref 36.0–46.0)
Hemoglobin: 12.2 g/dL (ref 12.0–15.0)
Immature Granulocytes: 0 %
Lymphocytes Relative: 32 %
Lymphs Abs: 1.8 10*3/uL (ref 0.7–4.0)
MCH: 28.9 pg (ref 26.0–34.0)
MCHC: 31.8 g/dL (ref 30.0–36.0)
MCV: 91 fL (ref 80.0–100.0)
Monocytes Absolute: 0.4 10*3/uL (ref 0.1–1.0)
Monocytes Relative: 8 %
Neutro Abs: 3.3 10*3/uL (ref 1.7–7.7)
Neutrophils Relative %: 58 %
Platelet Count: 227 10*3/uL (ref 150–400)
RBC: 4.22 MIL/uL (ref 3.87–5.11)
RDW: 13.8 % (ref 11.5–15.5)
WBC Count: 5.6 10*3/uL (ref 4.0–10.5)
nRBC: 0 % (ref 0.0–0.2)

## 2022-03-23 LAB — CMP (CANCER CENTER ONLY)
ALT: 15 U/L (ref 0–44)
AST: 21 U/L (ref 15–41)
Albumin: 4.2 g/dL (ref 3.5–5.0)
Alkaline Phosphatase: 92 U/L (ref 38–126)
Anion gap: 7 (ref 5–15)
BUN: 18 mg/dL (ref 8–23)
CO2: 28 mmol/L (ref 22–32)
Calcium: 9.7 mg/dL (ref 8.9–10.3)
Chloride: 102 mmol/L (ref 98–111)
Creatinine: 0.95 mg/dL (ref 0.44–1.00)
GFR, Estimated: >60 mL/min (ref >60)
Glucose, Bld: 113 mg/dL — ABNORMAL HIGH (ref 70–99)
Potassium: 4.6 mmol/L (ref 3.5–5.1)
Sodium: 137 mmol/L (ref 135–145)
Total Bilirubin: 0.5 mg/dL (ref 0.3–1.2)
Total Protein: 7.3 g/dL (ref 6.5–8.1)

## 2022-03-23 LAB — LACTATE DEHYDROGENASE: LDH: 154 U/L (ref 98–192)

## 2022-03-23 NOTE — Progress Notes (Signed)
Hematology and Oncology Follow Up Visit  Kaitlyn Hanna [id number] May 30, 1941 81 y.o. 03/23/2022   Principle Diagnosis:  IgA Kappa MGUS  Current Therapy:   Observation     Interim History:  Kaitlyn Hanna is back for follow-up.  We saw her 6 months ago.  As usual, she and her husband have been traveling.  They went to [place].  They have a new great grand child.  This is certainly quite exciting for them.  To be going down to [place] I think in September to see a grandson.  We last saw her back in January, her monoclonal spike 0.4 g/dL.  The IgA level was about 740 mg/dL.  The Kappa light chain was 3.6 mg/dL.  She has had no problem with infections.  She has had no change in bowel or bladder habits.  She has had no cough or shortness of breath..  She does have Sjogren's.  I do not think this is had any flareups.  There is been no rashes.  She has had no bleeding.  There is been no swollen lymph nodes.  Overall, I would say that her performance status is probably ECOG 0.    Medications:  Current Outpatient Medications:    Flaxseed, Linseed, (FLAXSEED OIL PO), Take by mouth daily., Disp: , Rfl:    Multiple Vitamins-Minerals (MULTIVITAMIN WITH MINERALS) tablet, Take 1 tablet by mouth daily., Disp: , Rfl:    Omega-3 Fatty Acids (FISH OIL PO), Take by mouth daily., Disp: , Rfl:   Allergies: No Known Allergies  Past Medical History, Surgical history, Social history, and Family History were reviewed and updated.  Review of Systems: Review of Systems  Constitutional: Negative.   HENT:  Negative.    Eyes: Negative.   Respiratory: Negative.    Cardiovascular: Negative.  Negative for palpitations.  Gastrointestinal: Negative.   Endocrine: Negative.   Genitourinary: Negative.    Musculoskeletal: Negative.   Skin: Negative.   Neurological: Negative.   Hematological: Negative.   Psychiatric/Behavioral: Negative.      Physical Exam:  height is 5' (1.524 m) and weight is 126 lb 1.9 oz  (57.2 kg). Her oral temperature is 98 F (36.7 C). Her blood pressure is 112/70 and her pulse is 64. Her respiration is 18 and oxygen saturation is 100%.   Wt Readings from Last 3 Encounters:  03/23/22 126 lb 1.9 oz (57.2 kg)  11/19/21 129 lb 4 oz (58.6 kg)  09/05/21 132 lb (59.9 kg)    Physical Exam Vitals reviewed.  HENT:     Head: Normocephalic and atraumatic.  Eyes:     Pupils: Pupils are equal, round, and reactive to light.  Cardiovascular:     Rate and Rhythm: Normal rate and regular rhythm.     Heart sounds: Normal heart sounds.  Pulmonary:     Effort: Pulmonary effort is normal.     Breath sounds: Normal breath sounds.  Abdominal:     General: Bowel sounds are normal.     Palpations: Abdomen is soft.  Musculoskeletal:        General: No tenderness or deformity. Normal range of motion.     Cervical back: Normal range of motion.  Lymphadenopathy:     Cervical: No cervical adenopathy.  Skin:    General: Skin is warm and dry.     Findings: No erythema or rash.  Neurological:     Mental Status: She is alert and oriented to person, place, and time.  Psychiatric:  Behavior: Behavior normal.        Thought Content: Thought content normal.        Judgment: Judgment normal.      Lab Results  Component Value Date   WBC 5.6 03/23/2022   HGB 12.2 03/23/2022   HCT 38.4 03/23/2022   MCV 91.0 03/23/2022   PLT 227 03/23/2022     Chemistry      Component Value Date/Time   NA 137 03/23/2022 1031   K 4.6 03/23/2022 1031   CL 102 03/23/2022 1031   CO2 28 03/23/2022 1031   BUN 18 03/23/2022 1031   CREATININE 0.95 03/23/2022 1031      Component Value Date/Time   CALCIUM 9.7 03/23/2022 1031   ALKPHOS 92 03/23/2022 1031   AST 21 03/23/2022 1031   ALT 15 03/23/2022 1031   BILITOT 0.5 03/23/2022 1031      Impression and Plan: Kaitlyn Hanna is a very charming 81 year old white female.  She looks quite younger.  She has an IgA kappa MGUS.  We will have to see what  the monoclonal studies look like.  Her total protein is not any higher.  Hopefully, this is a good indicator that the monoclonal spike is no worse.    Hopefully, this is all secondary to the Sjogren's.  We will plan for another follow-up in 6 months.  If everything looks stable, we might move everything up to a year.    Volanda Napoleon, MD 7/31/202311:34 AM

## 2022-03-24 LAB — IGG, IGA, IGM
IgA: 613 mg/dL — ABNORMAL HIGH (ref 64–422)
IgG (Immunoglobin G), Serum: 1252 mg/dL (ref 586–1602)
IgM (Immunoglobulin M), Srm: 111 mg/dL (ref 26–217)

## 2022-03-24 LAB — KAPPA/LAMBDA LIGHT CHAINS
Kappa free light chain: 35 mg/L — ABNORMAL HIGH (ref 3.3–19.4)
Kappa, lambda light chain ratio: 1.39 (ref 0.26–1.65)
Lambda free light chains: 25.1 mg/L (ref 5.7–26.3)

## 2022-03-25 ENCOUNTER — Inpatient Hospital Stay: Payer: Medicare Other | Attending: Hematology & Oncology

## 2022-03-25 DIAGNOSIS — E7849 Other hyperlipidemia: Secondary | ICD-10-CM

## 2022-03-25 DIAGNOSIS — M3500 Sicca syndrome, unspecified: Secondary | ICD-10-CM | POA: Insufficient documentation

## 2022-03-25 DIAGNOSIS — Z79899 Other long term (current) drug therapy: Secondary | ICD-10-CM | POA: Diagnosis not present

## 2022-03-25 DIAGNOSIS — D472 Monoclonal gammopathy: Secondary | ICD-10-CM | POA: Diagnosis not present

## 2022-03-25 LAB — LIPID PANEL
Cholesterol: 224 mg/dL — ABNORMAL HIGH (ref 0–200)
HDL: 52 mg/dL (ref >40)
LDL Cholesterol: 157 mg/dL — ABNORMAL HIGH (ref 0–99)
Total CHOL/HDL Ratio: 4.3 RATIO
Triglycerides: 77 mg/dL (ref <150)
VLDL: 15 mg/dL (ref 0–40)

## 2022-03-26 LAB — IMMUNOFIXATION REFLEX, SERUM
IgA: 603 mg/dL — ABNORMAL HIGH (ref 64–422)
IgG (Immunoglobin G), Serum: 1300 mg/dL (ref 586–1602)
IgM (Immunoglobulin M), Srm: 124 mg/dL (ref 26–217)

## 2022-03-26 LAB — PROTEIN ELECTROPHORESIS, SERUM, WITH REFLEX
A/G Ratio: 1.1 (ref 0.7–1.7)
Albumin ELP: 3.7 g/dL (ref 2.9–4.4)
Alpha-1-Globulin: 0.2 g/dL (ref 0.0–0.4)
Alpha-2-Globulin: 0.7 g/dL (ref 0.4–1.0)
Beta Globulin: 1.1 g/dL (ref 0.7–1.3)
Gamma Globulin: 1.5 g/dL (ref 0.4–1.8)
Globulin, Total: 3.5 g/dL (ref 2.2–3.9)
M-Spike, %: 0.4 g/dL — ABNORMAL HIGH
SPEP Interpretation: 0
Total Protein ELP: 7.2 g/dL (ref 6.0–8.5)

## 2022-03-27 ENCOUNTER — Telehealth: Payer: Self-pay

## 2022-03-27 NOTE — Telephone Encounter (Signed)
-----   Message from Volanda Napoleon, MD sent at 03/26/2022  4:49 PM EDT ----- Please call her and let her know that the abnormal protein that we are following is holding nice and low and stable at 0.4.

## 2022-03-27 NOTE — Telephone Encounter (Signed)
Advised via MyChart.

## 2022-04-03 DIAGNOSIS — Z1283 Encounter for screening for malignant neoplasm of skin: Secondary | ICD-10-CM | POA: Diagnosis not present

## 2022-04-03 DIAGNOSIS — D225 Melanocytic nevi of trunk: Secondary | ICD-10-CM | POA: Diagnosis not present

## 2022-04-03 DIAGNOSIS — B078 Other viral warts: Secondary | ICD-10-CM | POA: Diagnosis not present

## 2022-04-03 DIAGNOSIS — L821 Other seborrheic keratosis: Secondary | ICD-10-CM | POA: Diagnosis not present

## 2022-04-03 DIAGNOSIS — S91352A Open bite, left foot, initial encounter: Secondary | ICD-10-CM | POA: Diagnosis not present

## 2022-04-16 ENCOUNTER — Telehealth: Payer: Self-pay | Admitting: Family Medicine

## 2022-04-16 NOTE — Telephone Encounter (Signed)
LVM for patient to call back and schedule AWV at her earliest convenience.

## 2022-08-11 ENCOUNTER — Ambulatory Visit (HOSPITAL_BASED_OUTPATIENT_CLINIC_OR_DEPARTMENT_OTHER)
Admission: RE | Admit: 2022-08-11 | Discharge: 2022-08-11 | Disposition: A | Discharge status: Home / Self Care | Payer: Medicare Other | Source: Ambulatory Visit | Attending: Family Medicine | Admitting: Family Medicine

## 2022-08-11 ENCOUNTER — Ambulatory Visit (INDEPENDENT_AMBULATORY_CARE_PROVIDER_SITE_OTHER): Payer: Medicare Other | Admitting: Family Medicine

## 2022-08-11 VITALS — BP 118/72 | HR 72 | Temp 98.0°F | Resp 16 | Ht 61.0 in | Wt 133.0 lb

## 2022-08-11 DIAGNOSIS — Z0000 Encounter for general adult medical examination without abnormal findings: Secondary | ICD-10-CM | POA: Diagnosis not present

## 2022-08-11 DIAGNOSIS — M170 Bilateral primary osteoarthritis of knee: Secondary | ICD-10-CM | POA: Diagnosis not present

## 2022-08-11 DIAGNOSIS — R739 Hyperglycemia, unspecified: Secondary | ICD-10-CM | POA: Diagnosis not present

## 2022-08-11 DIAGNOSIS — M810 Age-related osteoporosis without current pathological fracture: Secondary | ICD-10-CM | POA: Diagnosis not present

## 2022-08-11 DIAGNOSIS — Z780 Asymptomatic menopausal state: Secondary | ICD-10-CM

## 2022-08-11 DIAGNOSIS — D472 Monoclonal gammopathy: Secondary | ICD-10-CM

## 2022-08-11 DIAGNOSIS — Z1231 Encounter for screening mammogram for malignant neoplasm of breast: Secondary | ICD-10-CM

## 2022-08-11 DIAGNOSIS — E782 Mixed hyperlipidemia: Secondary | ICD-10-CM | POA: Diagnosis not present

## 2022-08-11 DIAGNOSIS — M3501 Sicca syndrome with keratoconjunctivitis: Secondary | ICD-10-CM | POA: Diagnosis not present

## 2022-08-11 DIAGNOSIS — E2839 Other primary ovarian failure: Secondary | ICD-10-CM | POA: Diagnosis not present

## 2022-08-11 DIAGNOSIS — M79602 Pain in left arm: Secondary | ICD-10-CM

## 2022-08-11 DIAGNOSIS — M79622 Pain in left upper arm: Secondary | ICD-10-CM | POA: Diagnosis not present

## 2022-08-11 NOTE — Patient Instructions (Addendum)
RSV, Respiratory Syncitial, Arexvy at pharmacy  Shingrix is the new shingles shot, 2 shots over 2-6 months, confirm coverage with insurance and document, then can return here for shots with nurse appt or at [facility] [place] 65 Years and Older, Female Preventive care refers to lifestyle choices and visits with your health care provider that can promote health and wellness. Preventive care visits are also called wellness exams. What can I expect for my preventive care visit? Counseling Your health care provider may ask you questions about your: Medical history, including: Past medical problems. Family medical history. Pregnancy and menstrual history. History of falls. Current health, including: Memory and ability to understand (cognition). Emotional well-being. Home life and relationship well-being. Sexual activity and sexual health. Lifestyle, including: Alcohol, nicotine or tobacco, and drug use. Access to firearms. Diet, exercise, and sleep habits. Work and work Statistician. Sunscreen use. Safety issues such as seatbelt and bike helmet use. Physical exam Your health care provider will check your: Height and weight. These may be used to calculate your BMI (body mass index). BMI is a measurement that tells if you are at a healthy weight. Waist circumference. This measures the distance around your waistline. This measurement also tells if you are at a healthy weight and may help predict your risk of certain diseases, such as type 2 diabetes and high blood pressure. Heart rate and blood pressure. Body temperature. Skin for abnormal spots. What immunizations do I need?  Vaccines are usually given at various ages, according to a schedule. Your health care provider will recommend vaccines for you based on your age, medical history, and lifestyle or other factors, such as travel or where you work. What tests do I need? Screening Your health care provider may recommend  screening tests for certain conditions. This may include: Lipid and cholesterol levels. Hepatitis C test. Hepatitis B test. HIV (human immunodeficiency virus) test. STI (sexually transmitted infection) testing, if you are at risk. Lung cancer screening. Colorectal cancer screening. Diabetes screening. This is done by checking your blood sugar (glucose) after you have not eaten for a while (fasting). Mammogram. Talk with your health care provider about how often you should have regular mammograms. BRCA-related cancer screening. This may be done if you have a family history of breast, ovarian, tubal, or peritoneal cancers. Bone density scan. This is done to screen for osteoporosis. Talk with your health care provider about your test results, treatment options, and if necessary, the need for more tests. Follow these instructions at home: Eating and drinking  Eat a diet that includes fresh fruits and vegetables, whole grains, lean protein, and low-fat dairy products. Limit your intake of foods with high amounts of sugar, saturated fats, and salt. Take vitamin and mineral supplements as recommended by your health care provider. Do not drink alcohol if your health care provider tells you not to drink. If you drink alcohol: Limit how much you have to 0-1 drink a day. Know how much alcohol is in your drink. In the [place] one drink equals one 12 oz bottle of beer (355 mL), one 5 oz glass of wine (148 mL), or one 1 oz glass of hard liquor (44 mL). Lifestyle Brush your teeth every morning and night with fluoride toothpaste. Floss one time each day. Exercise for at least 30 minutes 5 or more days each week. Do not use any products that contain nicotine or tobacco. These products include cigarettes, chewing tobacco, and vaping devices, such as e-cigarettes. If you  need help quitting, ask your health care provider. Do not use drugs. If you are sexually active, practice safe sex. Use a condom or other  form of protection in order to prevent STIs. Take aspirin only as told by your health care provider. Make sure that you understand how much to take and what form to take. Work with your health care provider to find out whether it is safe and beneficial for you to take aspirin daily. Ask your health care provider if you need to take a cholesterol-lowering medicine (statin). Find healthy ways to manage stress, such as: Meditation, yoga, or listening to music. Journaling. Talking to a trusted person. Spending time with friends and family. Minimize exposure to UV radiation to reduce your risk of skin cancer. Safety Always wear your seat belt while driving or riding in a vehicle. Do not drive: If you have been drinking alcohol. Do not ride with someone who has been drinking. When you are tired or distracted. While texting. If you have been using any mind-altering substances or drugs. Wear a helmet and other protective equipment during sports activities. If you have firearms in your house, make sure you follow all gun safety procedures. What's next? Visit your health care provider once a year for an annual wellness visit. Ask your health care provider how often you should have your eyes and teeth checked. Stay up to date on all vaccines. This information is not intended to replace advice given to you by your health care provider. Make sure you discuss any questions you have with your health care provider. Document Revised: 02/05/2021 Document Reviewed: 02/05/2021 Elsevier Patient Education  [employer].

## 2022-08-11 NOTE — Progress Notes (Signed)
Subjective:   By signing my name below, I, [name], attest that this documentation has been prepared under the direction and in the presence of [name], MD., 08/11/2022.     Patient ID: Kaitlyn Hanna, female    DOB: May 28, 1941, 81 y.o.   MRN: [id number]  No chief complaint on file.  HPI Patient is in today for a comprehensive physical exam and follow up on chronic medical concerns. She denies CP/palpitations/ SOB/HA/congestion/fevers/GI or GU c/o.  Dermatology Patient is inquiring about a seborrhea keratosis on the upper right side of the midaxillary line. She does have a dermatologist that she sees as needed.   Family History No changes to the FHx.  Left Upper Arm Pain Patient complains of intermittent achy pain in her left upper arm that has been present for the past 3 months. She denies any injuries and states that the pain does not radiate. She is interested in following up on this with sports medicine.  Past Medical History:  Diagnosis Date   Benign schwannoma 06/30/2016   Goals of care, counseling/discussion 03/03/2021   H/O measles    Hearing loss in right ear 04/14/2016   MGUS (monoclonal gammopathy of unknown significance) 03/03/2021   Osteoporosis 04/14/2016   Raynaud's phenomenon 04/14/2016   Schwannoma of cranial nerve ([place]) 06/30/2016   Past Surgical History:  Procedure Laterality Date   ABDOMINAL HYSTERECTOMY     age 74 TAH b/l SPO for endometriosis   TONSILLECTOMY     WISDOM TOOTH EXTRACTION     Family History  Problem Relation Age of Onset   Heart disease Mother        MI   Cancer Father        lung cancer, small cell, smoker   Hypertension Sister    Dementia Sister    Alcohol abuse Brother    Cancer Maternal Grandmother    Dementia Sister    Arthritis Sister        Rheumtoid    Alzheimer's disease Sister    Cancer Brother    Social History   Socioeconomic History   Marital status: Married    Spouse name: Not on file   Number of  children: Not on file   Years of education: Not on file   Highest education level: Not on file  Occupational History   Not on file  Tobacco Use   Smoking status: Never   Smokeless tobacco: Never  Vaping Use   Vaping Use: Never used  Substance and Sexual Activity   Alcohol use: No   Drug use: No   Sexual activity: Yes  Other Topics Concern   Not on file  Social History Narrative   Lives with husband   Retired from medical office work with [employer]   No major dietary restrictions   Exercises daily. Walks and Y deep water exercise   Raised 2 stepkids, son and daughter   Social Determinants of Health   Financial Resource Strain: Low Risk  (10/24/2020)   Overall Financial Resource Strain (CARDIA)    Difficulty of Paying Living Expenses: Not hard at all  Food Insecurity: No Food Insecurity (10/24/2020)   Hunger Vital Sign    Worried About Running Out of Food in the Last Year: Never true    [place] in the Last Year: Never true  Transportation Needs: No Transportation Needs (10/24/2020)   PRAPARE - Hydrologist (Medical): No    Lack of Transportation (  Non-Medical): No  Physical Activity: Insufficiently Active (10/24/2020)   Exercise Vital Sign    Days of Exercise per Week: 3 days    Minutes of Exercise per Session: 20 min  Stress: No Stress Concern Present (10/24/2020)   [place]    Feeling of Stress : Not at all  Social Connections: Moderately Integrated (10/24/2020)   Social Connection and Isolation Panel [NHANES]    Frequency of Communication with Friends and Family: More than three times a week    Frequency of Social Gatherings with Friends and Family: More than three times a week    Attends Religious Services: More than 4 times per year    Active Member of [employer] or Organizations: No    Attends Archivist Meetings: Never    Marital Status: Married  Human resources officer  Violence: Not At Risk (10/24/2020)   Humiliation, Afraid, Rape, and Kick questionnaire    Fear of Current or Ex-Partner: No    Emotionally Abused: No    Physically Abused: No    Sexually Abused: No   Outpatient Medications Prior to Visit  Medication Sig Dispense Refill   Flaxseed, Linseed, (FLAXSEED OIL PO) Take by mouth daily.     Multiple Vitamins-Minerals (MULTIVITAMIN WITH MINERALS) tablet Take 1 tablet by mouth daily.     Omega-3 Fatty Acids (FISH OIL PO) Take by mouth daily.     No facility-administered medications prior to visit.   No Known Allergies  Review of Systems  Constitutional:  Negative for chills and fever.  HENT:  Negative for congestion.   Respiratory:  Negative for shortness of breath.   Cardiovascular:  Negative for chest pain and palpitations.  Gastrointestinal:  Negative for abdominal pain, blood in stool, constipation, diarrhea, nausea and vomiting.  Genitourinary:  Negative for dysuria, frequency, hematuria and urgency.  Musculoskeletal:        (+) left upper arm pain  Skin:           Neurological:  Negative for headaches.      Objective:    Physical Exam Constitutional:      General: She is not in acute distress.    Appearance: Normal appearance. She is normal weight. She is not ill-appearing.  HENT:     Head: Normocephalic and atraumatic.     Right Ear: Tympanic membrane, ear canal and external ear normal.     Left Ear: Tympanic membrane, ear canal and external ear normal.     Nose: Nose normal.     Mouth/Throat:     Mouth: Mucous membranes are moist.     Pharynx: Oropharynx is clear.  Eyes:     General:        Right eye: No discharge.        Left eye: No discharge.     Extraocular Movements: Extraocular movements intact.     Right eye: No nystagmus.     Left eye: No nystagmus.     Pupils: Pupils are equal, round, and reactive to light.  Neck:     Vascular: No carotid bruit.  Cardiovascular:     Rate and Rhythm: Normal rate and regular  rhythm.     Pulses: Normal pulses.     Heart sounds: Normal heart sounds. No murmur heard.    No gallop.  Pulmonary:     Effort: Pulmonary effort is normal. No respiratory distress.     Breath sounds: Normal breath sounds. No wheezing or rales.  Abdominal:  General: Bowel sounds are normal.     Palpations: Abdomen is soft.     Tenderness: There is no abdominal tenderness. There is no guarding.  Musculoskeletal:        General: Normal range of motion.     Cervical back: Normal range of motion.     Right lower leg: No edema.     Left lower leg: No edema.     Comments: Muscle strength 5/5 on upper and lower extremities.   Lymphadenopathy:     Cervical: No cervical adenopathy.  Skin:    General: Skin is warm and dry.     Comments: 0.5 cm x 0.5 cm oval, slightly grey, waxy seborrhea keratosis along the upper right side of the midaxillary line  Neurological:     Mental Status: She is alert and oriented to person, place, and time.     Sensory: Sensation is intact.     Motor: Motor function is intact.     Coordination: Coordination is intact.     Deep Tendon Reflexes:     Reflex Scores:      Patellar reflexes are 2+ on the right side and 2+ on the left side. Psychiatric:        Mood and Affect: Mood normal.        Behavior: Behavior normal.        Judgment: Judgment normal.    LMP  (LMP Unknown)  Wt Readings from Last 3 Encounters:  03/23/22 126 lb 1.9 oz (57.2 kg)  11/19/21 129 lb 4 oz (58.6 kg)  09/05/21 132 lb (59.9 kg)   Diabetic Foot Exam - Simple   No data filed    Lab Results  Component Value Date   WBC 5.6 03/23/2022   HGB 12.2 03/23/2022   HCT 38.4 03/23/2022   PLT 227 03/23/2022   GLUCOSE 113 (H) 03/23/2022   CHOL 224 (H) 03/25/2022   TRIG 77 03/25/2022   HDL 52 03/25/2022   LDLCALC 157 (H) 03/25/2022   ALT 15 03/23/2022   AST 21 03/23/2022   NA 137 03/23/2022   K 4.6 03/23/2022   CL 102 03/23/2022   CREATININE 0.95 03/23/2022   BUN 18 03/23/2022    CO2 28 03/23/2022   TSH 3.62 12/23/2020   HGBA1C 5.5 04/15/2021   Lab Results  Component Value Date   TSH 3.62 12/23/2020   Lab Results  Component Value Date   WBC 5.6 03/23/2022   HGB 12.2 03/23/2022   HCT 38.4 03/23/2022   MCV 91.0 03/23/2022   PLT 227 03/23/2022   Lab Results  Component Value Date   NA 137 03/23/2022   K 4.6 03/23/2022   CO2 28 03/23/2022   GLUCOSE 113 (H) 03/23/2022   BUN 18 03/23/2022   CREATININE 0.95 03/23/2022   BILITOT 0.5 03/23/2022   ALKPHOS 92 03/23/2022   AST 21 03/23/2022   ALT 15 03/23/2022   PROT 7.3 03/23/2022   ALBUMIN 4.2 03/23/2022   CALCIUM 9.7 03/23/2022   ANIONGAP 7 03/23/2022   GFR 53.97 (L) 07/01/2021   Lab Results  Component Value Date   CHOL 224 (H) 03/25/2022   Lab Results  Component Value Date   HDL 52 03/25/2022   Lab Results  Component Value Date   LDLCALC 157 (H) 03/25/2022   Lab Results  Component Value Date   TRIG 77 03/25/2022   Lab Results  Component Value Date   CHOLHDL 4.3 03/25/2022   Lab Results  Component Value Date  HGBA1C 5.5 04/15/2021      Assessment & Plan:  DEXA: Last completed on 12/21/2019. This patient is considered osteoporotic according to [facility]) criteria. Repeat in 2-5 years. Order placed.  Mammogram: Last completed on 04/26/2020. No mammographic evidence of malignancy. Repeat in 1-2 years. Order placed.  Dermatology: Encouraged patient to have body checked by dermatologist.  Healthy Lifestyle: Encouraged exercise, heart healthy diet, and hydration.  Immunizations: Encouraged COVID-19, Influenza, RSV, Shingles, and Tetanus (if injured) immunizations.  Labs: Routine blood work will be checked today.  Sports Medicine: Referral made to Dr. Raeford Razor and x-ray ordered of the left humerus.  Problem List Items Addressed This Visit   None  No orders of the defined types were placed in this encounter.  I, [name], personally preformed the services  described in this documentation.  All medical record entries made by the scribe were at my direction and in my presence.  I have reviewed the chart and discharge instructions (if applicable) and agree that the record reflects my personal performance and is accurate and complete. 08/11/2022  I,Mohammed Iqbal,acting as a scribe for Penni Homans, MD.,have documented all relevant documentation on the behalf of Penni Homans, MD,as directed by  Penni Homans, MD while in the presence of Penni Homans, MD.  [name]

## 2022-08-11 NOTE — Assessment & Plan Note (Signed)
Patient encouraged to maintain heart healthy diet, regular exercise, adequate sleep. Consider daily probiotics. Take medications as prescribed. Labs ordered and reviewed   DEXA: Last completed on 12/21/2019. This patient is considered osteoporotic according to [facility]) criteria. Repeat in 2-5 years. Order placed.   Mammogram: Last completed on 04/26/2020. No mammographic evidence of malignancy. Repeat in 1-2 years. Order placed.   Dermatology: Encouraged patient to have body checked by dermatologist.   Healthy Lifestyle: Encouraged exercise, heart healthy diet, and hydration.   Immunizations: Encouraged COVID-19, Influenza, RSV, Shingles, and Tetanus (if injured) immunizations.

## 2022-08-12 DIAGNOSIS — M79602 Pain in left arm: Secondary | ICD-10-CM | POA: Insufficient documentation

## 2022-08-12 DIAGNOSIS — R739 Hyperglycemia, unspecified: Secondary | ICD-10-CM | POA: Insufficient documentation

## 2022-08-12 LAB — CBC WITH DIFFERENTIAL/PLATELET
Basophils Absolute: 0 10*3/uL (ref 0.0–0.1)
Basophils Relative: 0.6 % (ref 0.0–3.0)
Eosinophils Absolute: 0.1 10*3/uL (ref 0.0–0.7)
Eosinophils Relative: 1.5 % (ref 0.0–5.0)
HCT: 36.6 % (ref 36.0–46.0)
Hemoglobin: 12.2 g/dL (ref 12.0–15.0)
Lymphocytes Relative: 28.3 % (ref 12.0–46.0)
Lymphs Abs: 2.2 10*3/uL (ref 0.7–4.0)
MCHC: 33.3 g/dL (ref 30.0–36.0)
MCV: 88.3 fl (ref 78.0–100.0)
Monocytes Absolute: 0.7 10*3/uL (ref 0.1–1.0)
Monocytes Relative: 8.6 % (ref 3.0–12.0)
Neutro Abs: 4.8 10*3/uL (ref 1.4–7.7)
Neutrophils Relative %: 61 % (ref 43.0–77.0)
Platelets: 269 10*3/uL (ref 150.0–400.0)
RBC: 4.14 Mil/uL (ref 3.87–5.11)
RDW: 14 % (ref 11.5–15.5)
WBC: 7.8 10*3/uL (ref 4.0–10.5)

## 2022-08-12 LAB — LIPID PANEL
Cholesterol: 225 mg/dL — ABNORMAL HIGH (ref 0–200)
HDL: 53.7 mg/dL (ref >39.00)
LDL Cholesterol: 148 mg/dL — ABNORMAL HIGH (ref 0–99)
NonHDL: 171.5
Total CHOL/HDL Ratio: 4
Triglycerides: 117 mg/dL (ref 0.0–149.0)
VLDL: 23.4 mg/dL (ref 0.0–40.0)

## 2022-08-12 LAB — COMPREHENSIVE METABOLIC PANEL
ALT: 11 U/L (ref 0–35)
AST: 18 U/L (ref 0–37)
Albumin: 4.1 g/dL (ref 3.5–5.2)
Alkaline Phosphatase: 80 U/L (ref 39–117)
BUN: 26 mg/dL — ABNORMAL HIGH (ref 6–23)
CO2: 29 mEq/L (ref 19–32)
Calcium: 9.3 mg/dL (ref 8.4–10.5)
Chloride: 102 mEq/L (ref 96–112)
Creatinine, Ser: 0.95 mg/dL (ref 0.40–1.20)
GFR: 56.95 mL/min — ABNORMAL LOW (ref >60.00)
Glucose, Bld: 86 mg/dL (ref 70–99)
Potassium: 4.1 mEq/L (ref 3.5–5.1)
Sodium: 140 mEq/L (ref 135–145)
Total Bilirubin: 0.4 mg/dL (ref 0.2–1.2)
Total Protein: 7.3 g/dL (ref 6.0–8.3)

## 2022-08-12 LAB — HEMOGLOBIN A1C: Hgb A1c MFr Bld: 6 % (ref 4.6–6.5)

## 2022-08-12 LAB — TSH: TSH: 2.92 u[IU]/mL (ref 0.35–5.50)

## 2022-08-12 NOTE — Assessment & Plan Note (Signed)
Tolerating statin, encouraged heart healthy diet, avoid trans fats, minimize simple carbs and saturated fats. Increase exercise as tolerated 

## 2022-08-12 NOTE — Assessment & Plan Note (Signed)
Encouraged to get adequate exercise, calcium and vitamin d intake 

## 2022-08-12 NOTE — Assessment & Plan Note (Signed)
She reports pain for several months, no fall or trauma. Pain is in mid humerus region over bicep. Check xray and referred to sports med.

## 2022-09-01 ENCOUNTER — Ambulatory Visit: Payer: Medicare Other | Admitting: Family Medicine

## 2022-09-07 ENCOUNTER — Inpatient Hospital Stay (HOSPITAL_BASED_OUTPATIENT_CLINIC_OR_DEPARTMENT_OTHER): Admission: RE | Admit: 2022-09-07 | Payer: Medicare Other | Source: Ambulatory Visit

## 2022-09-07 ENCOUNTER — Encounter (HOSPITAL_BASED_OUTPATIENT_CLINIC_OR_DEPARTMENT_OTHER): Payer: Self-pay

## 2022-09-07 ENCOUNTER — Other Ambulatory Visit (HOSPITAL_BASED_OUTPATIENT_CLINIC_OR_DEPARTMENT_OTHER): Payer: Medicare Other

## 2022-09-07 ENCOUNTER — Encounter: Payer: Self-pay | Admitting: Family Medicine

## 2022-09-07 ENCOUNTER — Ambulatory Visit (HOSPITAL_BASED_OUTPATIENT_CLINIC_OR_DEPARTMENT_OTHER)
Admission: RE | Admit: 2022-09-07 | Discharge: 2022-09-07 | Disposition: A | Discharge status: Home / Self Care | Payer: Medicare Other | Source: Ambulatory Visit | Attending: Family Medicine | Admitting: Family Medicine

## 2022-09-07 DIAGNOSIS — Z1231 Encounter for screening mammogram for malignant neoplasm of breast: Secondary | ICD-10-CM | POA: Insufficient documentation

## 2022-09-07 DIAGNOSIS — E2839 Other primary ovarian failure: Secondary | ICD-10-CM | POA: Diagnosis not present

## 2022-09-07 DIAGNOSIS — M810 Age-related osteoporosis without current pathological fracture: Secondary | ICD-10-CM | POA: Diagnosis not present

## 2022-09-07 DIAGNOSIS — Z780 Asymptomatic menopausal state: Secondary | ICD-10-CM | POA: Diagnosis not present

## 2022-09-22 ENCOUNTER — Encounter: Payer: Self-pay | Admitting: Medical Oncology

## 2022-09-22 ENCOUNTER — Inpatient Hospital Stay: Payer: Medicare Other | Attending: Hematology & Oncology

## 2022-09-22 ENCOUNTER — Other Ambulatory Visit: Payer: Self-pay

## 2022-09-22 ENCOUNTER — Inpatient Hospital Stay: Payer: Medicare Other | Admitting: Medical Oncology

## 2022-09-22 VITALS — BP 102/70 | HR 64 | Temp 97.7°F | Resp 17 | Ht 60.0 in | Wt 136.0 lb

## 2022-09-22 DIAGNOSIS — D472 Monoclonal gammopathy: Secondary | ICD-10-CM

## 2022-09-22 DIAGNOSIS — M3500 Sicca syndrome, unspecified: Secondary | ICD-10-CM | POA: Insufficient documentation

## 2022-09-22 DIAGNOSIS — M3504 Sicca syndrome with tubulo-interstitial nephropathy: Secondary | ICD-10-CM | POA: Diagnosis not present

## 2022-09-22 LAB — LACTATE DEHYDROGENASE: LDH: 151 U/L (ref 98–192)

## 2022-09-22 LAB — CBC WITH DIFFERENTIAL (CANCER CENTER ONLY)
Abs Immature Granulocytes: 0.01 10*3/uL (ref 0.00–0.07)
Basophils Absolute: 0.1 10*3/uL (ref 0.0–0.1)
Basophils Relative: 1 %
Eosinophils Absolute: 0.1 10*3/uL (ref 0.0–0.5)
Eosinophils Relative: 1 %
HCT: 39.4 % (ref 36.0–46.0)
Hemoglobin: 12.5 g/dL (ref 12.0–15.0)
Immature Granulocytes: 0 %
Lymphocytes Relative: 26 %
Lymphs Abs: 1.7 10*3/uL (ref 0.7–4.0)
MCH: 28.9 pg (ref 26.0–34.0)
MCHC: 31.7 g/dL (ref 30.0–36.0)
MCV: 91.2 fL (ref 80.0–100.0)
Monocytes Absolute: 0.6 10*3/uL (ref 0.1–1.0)
Monocytes Relative: 9 %
Neutro Abs: 4.2 10*3/uL (ref 1.7–7.7)
Neutrophils Relative %: 63 %
Platelet Count: 231 10*3/uL (ref 150–400)
RBC: 4.32 MIL/uL (ref 3.87–5.11)
RDW: 13.7 % (ref 11.5–15.5)
WBC Count: 6.7 10*3/uL (ref 4.0–10.5)
nRBC: 0 % (ref 0.0–0.2)

## 2022-09-22 LAB — CMP (CANCER CENTER ONLY)
ALT: 12 U/L (ref 0–44)
AST: 19 U/L (ref 15–41)
Albumin: 4.3 g/dL (ref 3.5–5.0)
Alkaline Phosphatase: 77 U/L (ref 38–126)
Anion gap: 8 (ref 5–15)
BUN: 13 mg/dL (ref 8–23)
CO2: 29 mmol/L (ref 22–32)
Calcium: 9.4 mg/dL (ref 8.9–10.3)
Chloride: 101 mmol/L (ref 98–111)
Creatinine: 0.97 mg/dL (ref 0.44–1.00)
GFR, Estimated: 59 mL/min — ABNORMAL LOW (ref >60)
Glucose, Bld: 94 mg/dL (ref 70–99)
Potassium: 4.1 mmol/L (ref 3.5–5.1)
Sodium: 138 mmol/L (ref 135–145)
Total Bilirubin: 0.4 mg/dL (ref 0.3–1.2)
Total Protein: 7.3 g/dL (ref 6.5–8.1)

## 2022-09-22 NOTE — Progress Notes (Signed)
Hematology and Oncology Follow Up Visit  Kaitlyn Hanna [id number] [birth year] 82 y.o. 09/22/2022   Principle Diagnosis:  IgA Kappa MGUS  Current Therapy:   Observation     Interim History:  Kaitlyn Hanna is back for follow-up.  She is seen every 6 months.   She states that she is doing well. Slowing down slightly but overall ok. She is sewing and quilting which she enjoys.   When Kaitlyn Hanna last saw her back in January, her monoclonal spike 0.4 g/dL.  The IgA level was 613 mg/dL.  The Kappa light chain was 3.5 mg/dL. These were all a bit lower than previous labs.   She has had no problem with infections.  She has had no change in bowel or bladder habits.  She has had no cough or shortness of breath. Sjogren's has been stable  There is been no rashes.  She has had no bleeding.  There is been no swollen lymph nodes. No night sweats or unintentional weight loss  Overall, I would say that her performance status is probably ECOG 0.   Wt Readings from Last 3 Encounters:  09/22/22 136 lb (61.7 kg)  08/11/22 133 lb (60.3 kg)  03/23/22 126 lb 1.9 oz (57.2 kg)     Medications:  Current Outpatient Medications:    Flaxseed, Linseed, (FLAXSEED OIL PO), Take by mouth daily., Disp: , Rfl:    Multiple Vitamins-Minerals (MULTIVITAMIN WITH MINERALS) tablet, Take 1 tablet by mouth daily., Disp: , Rfl:    Multiple Vitamins-Minerals (VITAMINS/MINERALS PO), as directed Orally, Disp: , Rfl:    Omega-3 Fatty Acids (FISH OIL PO), Take by mouth daily., Disp: , Rfl:   Allergies: No Known Allergies  Past Medical History, Surgical history, Social history, and Family History were reviewed and updated.  Review of Systems: Review of Systems  Constitutional: Negative.   HENT:  Negative.    Eyes: Negative.   Respiratory: Negative.    Cardiovascular: Negative.  Negative for palpitations.  Gastrointestinal: Negative.   Endocrine: Negative.   Genitourinary: Negative.    Musculoskeletal: Negative.   Skin:  Negative.   Neurological: Negative.   Hematological: Negative.   Psychiatric/Behavioral: Negative.      Physical Exam:  height is 5' (1.524 m) and weight is 136 lb (61.7 kg). Her oral temperature is 97.7 F (36.5 C). Her blood pressure is 102/70 and her pulse is 64. Her respiration is 17 and oxygen saturation is 99%.   Wt Readings from Last 3 Encounters:  09/22/22 136 lb (61.7 kg)  08/11/22 133 lb (60.3 kg)  03/23/22 126 lb 1.9 oz (57.2 kg)    Physical Exam Vitals reviewed.  HENT:     Head: Normocephalic and atraumatic.  Eyes:     Pupils: Pupils are equal, round, and reactive to light.  Cardiovascular:     Rate and Rhythm: Normal rate and regular rhythm.     Heart sounds: Normal heart sounds.  Pulmonary:     Effort: Pulmonary effort is normal.     Breath sounds: Normal breath sounds.  Abdominal:     General: Bowel sounds are normal.     Palpations: Abdomen is soft.  Musculoskeletal:        General: No tenderness or deformity. Normal range of motion.     Cervical back: Normal range of motion.  Lymphadenopathy:     Cervical: No cervical adenopathy.  Skin:    General: Skin is warm and dry.     Findings: No erythema or rash.  Neurological:     Mental Status: She is alert and oriented to person, place, and time.  Psychiatric:        Behavior: Behavior normal.        Thought Content: Thought content normal.        Judgment: Judgment normal.      Lab Results  Component Value Date   WBC 6.7 09/22/2022   HGB 12.5 09/22/2022   HCT 39.4 09/22/2022   MCV 91.2 09/22/2022   PLT 231 09/22/2022     Chemistry      Component Value Date/Time   NA 138 09/22/2022 0923   K 4.1 09/22/2022 0923   CL 101 09/22/2022 0923   CO2 29 09/22/2022 0923   BUN 13 09/22/2022 0923   CREATININE 0.97 09/22/2022 0923      Component Value Date/Time   CALCIUM 9.4 09/22/2022 0923   ALKPHOS 77 09/22/2022 0923   AST 19 09/22/2022 0923   ALT 12 09/22/2022 0923   BILITOT 0.4 09/22/2022 0923       Impression and Plan: Kaitlyn Hanna is a very charming 82 year old white female. She has an IgA kappa MGUS which is thought to be related to her Sjogren's.  Labs pending however she is feeling well and her CBC is stable. She will continued to stay active and we will see her in 6 months or sooner as needed.   Disposition: RTC 6 months MD, labs (CBC w/, CMP, IgG/IgA, IgM, light chains, LDH, Beta 2 microglobulin, serum electrophoresis)   Hughie Closs, PA-C 1/30/202410:38 AM

## 2022-09-23 LAB — KAPPA/LAMBDA LIGHT CHAINS
Kappa free light chain: 33.8 mg/L — ABNORMAL HIGH (ref 3.3–19.4)
Kappa, lambda light chain ratio: 1.15 (ref 0.26–1.65)
Lambda free light chains: 29.3 mg/L — ABNORMAL HIGH (ref 5.7–26.3)

## 2022-09-23 LAB — BETA 2 MICROGLOBULIN, SERUM: Beta-2 Microglobulin: 2.1 mg/L (ref 0.6–2.4)

## 2022-09-24 LAB — IGG, IGA, IGM
IgA: 638 mg/dL — ABNORMAL HIGH (ref 64–422)
IgG (Immunoglobin G), Serum: 1243 mg/dL (ref 586–1602)
IgM (Immunoglobulin M), Srm: 105 mg/dL (ref 26–217)

## 2022-10-01 LAB — IMMUNOFIXATION REFLEX, SERUM
IgA: 656 mg/dL — ABNORMAL HIGH (ref 64–422)
IgG (Immunoglobin G), Serum: 1435 mg/dL (ref 586–1602)
IgM (Immunoglobulin M), Srm: 120 mg/dL (ref 26–217)

## 2022-10-01 LAB — PROTEIN ELECTROPHORESIS, SERUM, WITH REFLEX
A/G Ratio: 1.2 (ref 0.7–1.7)
Albumin ELP: 3.8 g/dL (ref 2.9–4.4)
Alpha-1-Globulin: 0.2 g/dL (ref 0.0–0.4)
Alpha-2-Globulin: 0.6 g/dL (ref 0.4–1.0)
Beta Globulin: 1 g/dL (ref 0.7–1.3)
Gamma Globulin: 1.4 g/dL (ref 0.4–1.8)
Globulin, Total: 3.2 g/dL (ref 2.2–3.9)
M-Spike, %: 0.3 g/dL — ABNORMAL HIGH
SPEP Interpretation: 0
Total Protein ELP: 7 g/dL (ref 6.0–8.5)

## 2023-01-26 ENCOUNTER — Ambulatory Visit: Payer: Medicare Other | Admitting: Family

## 2023-02-24 DIAGNOSIS — H905 Unspecified sensorineural hearing loss: Secondary | ICD-10-CM | POA: Diagnosis not present

## 2023-03-02 ENCOUNTER — Telehealth: Payer: Self-pay | Admitting: Family Medicine

## 2023-03-02 NOTE — Telephone Encounter (Signed)
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 Pt wants to know if Dr. Abner Greenspan can order the MRI or refer her to another ENT. Please call her back to advise ( #[phone])

## 2023-03-04 NOTE — Telephone Encounter (Signed)
Called pt spoke with husband not able to talk, ask me to call back later.

## 2023-03-07 ENCOUNTER — Other Ambulatory Visit: Payer: Self-pay | Admitting: Family Medicine

## 2023-03-07 DIAGNOSIS — D3610 Benign neoplasm of peripheral nerves and autonomic nervous system, unspecified: Secondary | ICD-10-CM

## 2023-03-07 DIAGNOSIS — H9190 Unspecified hearing loss, unspecified ear: Secondary | ICD-10-CM

## 2023-03-08 ENCOUNTER — Telehealth: Payer: Self-pay | Admitting: Family Medicine

## 2023-03-08 NOTE — Telephone Encounter (Signed)
[employer] called with pt on the line to verify if info regarding PA for pt's MRI had been submitted. Rep stated that there was no record of it in their system. Advised a message would be sent back to look into this and we would give the pt a call when info is available.

## 2023-03-17 ENCOUNTER — Ambulatory Visit (HOSPITAL_BASED_OUTPATIENT_CLINIC_OR_DEPARTMENT_OTHER)
Admission: RE | Admit: 2023-03-17 | Discharge: 2023-03-17 | Disposition: A | Discharge status: Home / Self Care | Payer: Medicare Other | Source: Ambulatory Visit | Attending: Family Medicine | Admitting: Family Medicine

## 2023-03-17 DIAGNOSIS — H9190 Unspecified hearing loss, unspecified ear: Secondary | ICD-10-CM | POA: Diagnosis not present

## 2023-03-17 DIAGNOSIS — D3610 Benign neoplasm of peripheral nerves and autonomic nervous system, unspecified: Secondary | ICD-10-CM | POA: Diagnosis not present

## 2023-03-17 DIAGNOSIS — R220 Localized swelling, mass and lump, head: Secondary | ICD-10-CM | POA: Diagnosis not present

## 2023-03-17 DIAGNOSIS — H9191 Unspecified hearing loss, right ear: Secondary | ICD-10-CM | POA: Diagnosis not present

## 2023-03-17 MED ORDER — GADOBUTROL 1 MMOL/ML IV SOLN
6.0000 mL | Freq: Once | INTRAVENOUS | Status: AC | PRN
  Administered 2023-03-17: 6 mL via INTRAVENOUS

## 2023-03-22 ENCOUNTER — Other Ambulatory Visit: Payer: Self-pay | Admitting: Medical Oncology

## 2023-03-22 DIAGNOSIS — D472 Monoclonal gammopathy: Secondary | ICD-10-CM

## 2023-03-23 ENCOUNTER — Encounter: Payer: Self-pay | Admitting: Medical Oncology

## 2023-03-23 ENCOUNTER — Other Ambulatory Visit: Payer: Self-pay

## 2023-03-23 ENCOUNTER — Inpatient Hospital Stay: Payer: Medicare Other | Attending: Hematology & Oncology

## 2023-03-23 ENCOUNTER — Inpatient Hospital Stay: Payer: Medicare Other | Admitting: Medical Oncology

## 2023-03-23 VITALS — BP 114/69 | HR 62 | Temp 97.9°F | Resp 18 | Ht 60.0 in | Wt 133.4 lb

## 2023-03-23 DIAGNOSIS — D472 Monoclonal gammopathy: Secondary | ICD-10-CM | POA: Diagnosis not present

## 2023-03-23 DIAGNOSIS — M3500 Sicca syndrome, unspecified: Secondary | ICD-10-CM | POA: Diagnosis not present

## 2023-03-23 DIAGNOSIS — D333 Benign neoplasm of cranial nerves: Secondary | ICD-10-CM | POA: Insufficient documentation

## 2023-03-23 LAB — CMP (CANCER CENTER ONLY)
ALT: 10 U/L (ref 0–44)
AST: 19 U/L (ref 15–41)
Albumin: 4.2 g/dL (ref 3.5–5.0)
Alkaline Phosphatase: 78 U/L (ref 38–126)
Anion gap: 6 (ref 5–15)
BUN: 21 mg/dL (ref 8–23)
CO2: 30 mmol/L (ref 22–32)
Calcium: 9.1 mg/dL (ref 8.9–10.3)
Chloride: 104 mmol/L (ref 98–111)
Creatinine: 1 mg/dL (ref 0.44–1.00)
GFR, Estimated: 57 mL/min — ABNORMAL LOW (ref >60)
Glucose, Bld: 95 mg/dL (ref 70–99)
Potassium: 4.1 mmol/L (ref 3.5–5.1)
Sodium: 140 mmol/L (ref 135–145)
Total Bilirubin: 0.5 mg/dL (ref 0.3–1.2)
Total Protein: 7.7 g/dL (ref 6.5–8.1)

## 2023-03-23 LAB — CBC WITH DIFFERENTIAL (CANCER CENTER ONLY)
Abs Immature Granulocytes: 0.01 10*3/uL (ref 0.00–0.07)
Basophils Absolute: 0.1 10*3/uL (ref 0.0–0.1)
Basophils Relative: 1 %
Eosinophils Absolute: 0.1 10*3/uL (ref 0.0–0.5)
Eosinophils Relative: 1 %
HCT: 39.1 % (ref 36.0–46.0)
Hemoglobin: 12.5 g/dL (ref 12.0–15.0)
Immature Granulocytes: 0 %
Lymphocytes Relative: 26 %
Lymphs Abs: 1.5 10*3/uL (ref 0.7–4.0)
MCH: 29.6 pg (ref 26.0–34.0)
MCHC: 32 g/dL (ref 30.0–36.0)
MCV: 92.4 fL (ref 80.0–100.0)
Monocytes Absolute: 0.5 10*3/uL (ref 0.1–1.0)
Monocytes Relative: 9 %
Neutro Abs: 3.6 10*3/uL (ref 1.7–7.7)
Neutrophils Relative %: 63 %
Platelet Count: 236 10*3/uL (ref 150–400)
RBC: 4.23 MIL/uL (ref 3.87–5.11)
RDW: 13.6 % (ref 11.5–15.5)
WBC Count: 5.8 10*3/uL (ref 4.0–10.5)
nRBC: 0 % (ref 0.0–0.2)

## 2023-03-23 LAB — LACTATE DEHYDROGENASE: LDH: 168 U/L (ref 98–192)

## 2023-03-23 NOTE — Progress Notes (Signed)
Hematology and Oncology Follow Up Visit  Kaitlyn Hanna [id number] 1941/04/25 82 y.o. 03/23/2023   Principle Diagnosis:  IgA Kappa MGUS  Current Therapy:   Observation     Interim History:  Kaitlyn Hanna is back for follow-up.  She is seen every 6 months.   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 She is being followed for this and is considering surgery at [facility]. Other than this she is well.   When Dr. Rexene Edison last saw her back in January, her monoclonal spike 0.3 g/dL.  The IgA level was 638 mg/dL.  The Kappa light chain was 3.4 mg/dL.   She has had no problem with infections.  She has had no change in bowel or bladder habits.  She has had no cough or shortness of breath. Sjogren's has been stable  There is been no rashes.  She has had no bleeding.  There is been no swollen lymph nodes. No night sweats or unintentional weight loss  Overall, I would say that her performance status is probably ECOG 0.   Wt Readings from Last 3 Encounters:  03/23/23 133 lb 6.4 oz (60.5 kg)  09/22/22 136 lb (61.7 kg)  08/11/22 133 lb (60.3 kg)     Medications:  Current Outpatient Medications:    Flaxseed, Linseed, (FLAXSEED OIL PO), Take by mouth daily., Disp: , Rfl:    Multiple Vitamins-Minerals (MULTIVITAMIN WITH MINERALS) tablet, Take 1 tablet by mouth daily., Disp: , Rfl:    Multiple Vitamins-Minerals (VITAMINS/MINERALS PO), as directed Orally, Disp: , Rfl:    Omega-3 Fatty Acids (FISH OIL PO), Take by mouth daily., Disp: , Rfl:   Allergies: No Known Allergies  Past Medical History, Surgical history, Social history, and Family History were reviewed and updated.  Review of Systems: Review of Systems  Constitutional: Negative.   HENT:  Negative.    Eyes: Negative.   Respiratory: Negative.    Cardiovascular: Negative.  Negative for palpitations.  Gastrointestinal: Negative.   Endocrine: Negative.   Genitourinary: Negative.     Musculoskeletal: Negative.   Skin: Negative.   Neurological: Negative.   Hematological: Negative.   Psychiatric/Behavioral: Negative.      Physical Exam:  height is 5' (1.524 m) and weight is 133 lb 6.4 oz (60.5 kg). Her oral temperature is 97.9 F (36.6 C). Her blood pressure is 114/69 and her pulse is 62. Her respiration is 18 and oxygen saturation is 100%.   Wt Readings from Last 3 Encounters:  03/23/23 133 lb 6.4 oz (60.5 kg)  09/22/22 136 lb (61.7 kg)  08/11/22 133 lb (60.3 kg)    Physical Exam Vitals reviewed.  HENT:     Head: Normocephalic and atraumatic.  Eyes:     Pupils: Pupils are equal, round, and reactive to light.  Cardiovascular:     Rate and Rhythm: Normal rate and regular rhythm.     Heart sounds: Normal heart sounds.  Pulmonary:     Effort: Pulmonary effort is normal.     Breath sounds: Normal breath sounds.  Abdominal:     General: Bowel sounds are normal.     Palpations: Abdomen is soft.  Musculoskeletal:        General: No tenderness or deformity. Normal range of motion.     Cervical back: Normal range of motion.  Lymphadenopathy:     Cervical: No cervical adenopathy.  Skin:    General: Skin is warm and dry.  Findings: No erythema or rash.  Neurological:     Mental Status: She is alert and oriented to person, place, and time.  Psychiatric:        Behavior: Behavior normal.        Thought Content: Thought content normal.        Judgment: Judgment normal.      Lab Results  Component Value Date   WBC 5.8 03/23/2023   HGB 12.5 03/23/2023   HCT 39.1 03/23/2023   MCV 92.4 03/23/2023   PLT 236 03/23/2023     Chemistry      Component Value Date/Time   NA 140 03/23/2023 0922   K 4.1 03/23/2023 0922   CL 104 03/23/2023 0922   CO2 30 03/23/2023 0922   BUN 21 03/23/2023 0922   CREATININE 1.00 03/23/2023 0922      Component Value Date/Time   CALCIUM 9.1 03/23/2023 0922   ALKPHOS 78 03/23/2023 0922   AST 19 03/23/2023 0922   ALT 10  03/23/2023 0922   BILITOT 0.5 03/23/2023 1610      Impression and Plan: Kaitlyn Hanna is a very charming 82 year old white female. She has an IgA kappa MGUS which is thought to be related to her Sjogren's.  MGUS labs pending. CBC reviewed with patient in office. For now as her labs have been so stable she wishes to go to once per year follow up which I feel is reasonable.    Disposition: RTC 12 months MD, labs (CBC w/, CMP, IgG/IgA, IgM, light chains, LDH, Beta 2 microglobulin, serum electrophoresis)   Rushie Chestnut, PA-C 7/30/202410:06 AM

## 2023-04-13 ENCOUNTER — Other Ambulatory Visit: Payer: Self-pay

## 2023-04-13 ENCOUNTER — Ambulatory Visit (INDEPENDENT_AMBULATORY_CARE_PROVIDER_SITE_OTHER): Payer: Medicare Other | Admitting: Family

## 2023-04-13 ENCOUNTER — Emergency Department (HOSPITAL_BASED_OUTPATIENT_CLINIC_OR_DEPARTMENT_OTHER)
Admission: EM | Admit: 2023-04-13 | Discharge: 2023-04-13 | Disposition: A | Discharge status: Home / Self Care | Payer: Medicare Other | Attending: Emergency Medicine | Admitting: Emergency Medicine

## 2023-04-13 ENCOUNTER — Emergency Department (HOSPITAL_BASED_OUTPATIENT_CLINIC_OR_DEPARTMENT_OTHER): Payer: Medicare Other

## 2023-04-13 ENCOUNTER — Encounter (HOSPITAL_BASED_OUTPATIENT_CLINIC_OR_DEPARTMENT_OTHER): Payer: Self-pay

## 2023-04-13 VITALS — BP 118/74 | HR 77 | Temp 98.4°F | Wt 133.0 lb

## 2023-04-13 DIAGNOSIS — R109 Unspecified abdominal pain: Secondary | ICD-10-CM | POA: Insufficient documentation

## 2023-04-13 DIAGNOSIS — M25511 Pain in right shoulder: Secondary | ICD-10-CM | POA: Diagnosis not present

## 2023-04-13 DIAGNOSIS — J029 Acute pharyngitis, unspecified: Secondary | ICD-10-CM

## 2023-04-13 DIAGNOSIS — R071 Chest pain on breathing: Secondary | ICD-10-CM | POA: Diagnosis not present

## 2023-04-13 DIAGNOSIS — I700 Atherosclerosis of aorta: Secondary | ICD-10-CM | POA: Diagnosis not present

## 2023-04-13 DIAGNOSIS — S70362A Insect bite (nonvenomous), left thigh, initial encounter: Secondary | ICD-10-CM | POA: Diagnosis not present

## 2023-04-13 DIAGNOSIS — R079 Chest pain, unspecified: Secondary | ICD-10-CM | POA: Diagnosis not present

## 2023-04-13 DIAGNOSIS — R0789 Other chest pain: Secondary | ICD-10-CM | POA: Diagnosis not present

## 2023-04-13 DIAGNOSIS — R1011 Right upper quadrant pain: Secondary | ICD-10-CM | POA: Diagnosis not present

## 2023-04-13 DIAGNOSIS — W57XXXA Bitten or stung by nonvenomous insect and other nonvenomous arthropods, initial encounter: Secondary | ICD-10-CM | POA: Diagnosis not present

## 2023-04-13 DIAGNOSIS — H1033 Unspecified acute conjunctivitis, bilateral: Secondary | ICD-10-CM | POA: Insufficient documentation

## 2023-04-13 DIAGNOSIS — J020 Streptococcal pharyngitis: Secondary | ICD-10-CM | POA: Diagnosis not present

## 2023-04-13 LAB — HEPATIC FUNCTION PANEL
ALT: 13 U/L (ref 0–44)
AST: 19 U/L (ref 15–41)
Albumin: 3.6 g/dL (ref 3.5–5.0)
Alkaline Phosphatase: 89 U/L (ref 38–126)
Bilirubin, Direct: <0.1 mg/dL (ref 0.0–0.2)
Total Bilirubin: 0.3 mg/dL (ref 0.3–1.2)
Total Protein: 7.4 g/dL (ref 6.5–8.1)

## 2023-04-13 LAB — CBC WITH DIFFERENTIAL/PLATELET
Abs Immature Granulocytes: 0.03 10*3/uL (ref 0.00–0.07)
Basophils Absolute: 0.1 10*3/uL (ref 0.0–0.1)
Basophils Relative: 1 %
Eosinophils Absolute: 0.1 10*3/uL (ref 0.0–0.5)
Eosinophils Relative: 2 %
HCT: 37.2 % (ref 36.0–46.0)
Hemoglobin: 12 g/dL (ref 12.0–15.0)
Immature Granulocytes: 0 %
Lymphocytes Relative: 25 %
Lymphs Abs: 2.2 10*3/uL (ref 0.7–4.0)
MCH: 28.9 pg (ref 26.0–34.0)
MCHC: 32.3 g/dL (ref 30.0–36.0)
MCV: 89.6 fL (ref 80.0–100.0)
Monocytes Absolute: 0.7 10*3/uL (ref 0.1–1.0)
Monocytes Relative: 8 %
Neutro Abs: 5.8 10*3/uL (ref 1.7–7.7)
Neutrophils Relative %: 64 %
Platelets: 285 10*3/uL (ref 150–400)
RBC: 4.15 MIL/uL (ref 3.87–5.11)
RDW: 13.3 % (ref 11.5–15.5)
WBC: 9 10*3/uL (ref 4.0–10.5)
nRBC: 0 % (ref 0.0–0.2)

## 2023-04-13 LAB — TROPONIN I (HIGH SENSITIVITY)
Troponin I (High Sensitivity): 3 ng/L (ref <18)
Troponin I (High Sensitivity): 3 ng/L (ref <18)

## 2023-04-13 LAB — POCT RAPID STREP A (OFFICE): Rapid Strep A Screen: POSITIVE — AB

## 2023-04-13 LAB — BASIC METABOLIC PANEL
Anion gap: 11 (ref 5–15)
BUN: 17 mg/dL (ref 8–23)
CO2: 28 mmol/L (ref 22–32)
Calcium: 8.9 mg/dL (ref 8.9–10.3)
Chloride: 98 mmol/L (ref 98–111)
Creatinine, Ser: 0.94 mg/dL (ref 0.44–1.00)
GFR, Estimated: >60 mL/min (ref >60)
Glucose, Bld: 137 mg/dL — ABNORMAL HIGH (ref 70–99)
Potassium: 3.7 mmol/L (ref 3.5–5.1)
Sodium: 137 mmol/L (ref 135–145)

## 2023-04-13 LAB — POC COVID19 BINAXNOW: SARS Coronavirus 2 Ag: NEGATIVE — AB

## 2023-04-13 LAB — LIPASE, BLOOD: Lipase: 40 U/L (ref 11–51)

## 2023-04-13 MED ORDER — AMOXICILLIN 500 MG PO CAPS: 500.0000 mg | ORAL_CAPSULE | Freq: Three times a day (TID) | ORAL | 0 refills | Status: AC

## 2023-04-13 MED ORDER — BETAMETHASONE VALERATE 0.1 % EX OINT: 1.0000 | TOPICAL_OINTMENT | Freq: Two times a day (BID) | CUTANEOUS | 0 refills | Status: DC

## 2023-04-13 MED ORDER — MORPHINE SULFATE (PF) 4 MG/ML IV SOLN: 4.0000 mg | Freq: Once | INTRAVENOUS | Status: DC

## 2023-04-13 MED ORDER — IOHEXOL 350 MG/ML SOLN
100.0000 mL | Freq: Once | INTRAVENOUS | Status: AC | PRN
  Administered 2023-04-13: 100 mL via INTRAVENOUS

## 2023-04-13 MED ORDER — FENTANYL CITRATE PF 50 MCG/ML IJ SOSY
25.0000 ug | PREFILLED_SYRINGE | Freq: Once | INTRAMUSCULAR | Status: AC
  Administered 2023-04-13: 25 ug via INTRAVENOUS
  Filled 2023-04-13: qty 1

## 2023-04-13 MED ORDER — NEOMYCIN-POLYMYXIN-HC 3.5-10000-1 OP SUSP
3.0000 [drp] | Freq: Three times a day (TID) | OPHTHALMIC | 0 refills | Status: AC
Start: 2023-04-13 — End: 2023-04-18

## 2023-04-13 MED ORDER — KETOROLAC TROMETHAMINE 30 MG/ML IJ SOLN
30.0000 mg | Freq: Once | INTRAMUSCULAR | Status: AC
  Administered 2023-04-13: 30 mg via INTRAVENOUS
  Filled 2023-04-13: qty 1

## 2023-04-13 NOTE — ED Triage Notes (Signed)
Pt reports R sided shoulder pain that radiates to RUQ along with painful respiration.

## 2023-04-13 NOTE — Assessment & Plan Note (Addendum)
New. Suspect conjunctivitis due to strep. Rx with cortisporin.  If this doesn't help, we discussed that it may be the restasis causing her irritation.

## 2023-04-13 NOTE — ED Provider Notes (Signed)
[facility] Provider Note   CSN: [id number] Arrival date & time: 04/13/23  0112     History  Chief Complaint  Patient presents with   Shoulder Pain   Chest Pain    Kaitlyn Hanna is a 82 y.o. female.  The history is provided by the patient.  Shoulder Pain Location:  Shoulder Shoulder location:  R shoulder Injury: no   Pain details:    Quality:  Shooting   Severity:  Severe   Onset quality:  Sudden   Timing:  Constant   Progression:  Unchanged Dislocation: no   Relieved by:  Nothing Worsened by:  Nothing Ineffective treatments:  None tried Associated symptoms: no fever, no muscle weakness and no swelling   Risk factors: no concern for non-accidental trauma   Pain from shoulder radiates through the chest and into the right abdomen.  Pain started suddenly in bed.       Home Medications Prior to Admission medications   Medication Sig Start Date End Date Taking? Authorizing Provider  Flaxseed, Linseed, (FLAXSEED OIL PO) Take by mouth daily.    [provider]  Multiple Vitamins-Minerals (MULTIVITAMIN WITH MINERALS) tablet Take 1 tablet by mouth daily.    [provider]  Multiple Vitamins-Minerals (VITAMINS/MINERALS PO) as directed Orally    [provider]  Omega-3 Fatty Acids (FISH OIL PO) Take by mouth daily.    [provider]      Allergies    Patient has no known allergies.    Review of Systems   Review of Systems  Constitutional:  Negative for fever.  HENT:  Negative for facial swelling.   Cardiovascular:  Positive for chest pain.  Gastrointestinal:  Positive for abdominal pain.  Musculoskeletal:  Positive for arthralgias.  Neurological:  Negative for weakness and numbness.  All other systems reviewed and are negative.   Physical Exam Updated Vital Signs BP 130/70 (BP Location: Left Arm)   Pulse 66   Temp 98 F (36.7 C) (Oral)   Resp 18   LMP  (LMP Unknown)   SpO2  94%  Physical Exam Vitals and nursing note reviewed. Exam conducted with a chaperone present.  Constitutional:      General: She is not in acute distress.    Appearance: Normal appearance. She is well-developed.  HENT:     Head: Normocephalic and atraumatic.     Nose: Nose normal.  Eyes:     Pupils: Pupils are equal, round, and reactive to light.  Cardiovascular:     Rate and Rhythm: Normal rate and regular rhythm.     Pulses: Normal pulses.     Heart sounds: Normal heart sounds.  Pulmonary:     Effort: Pulmonary effort is normal. No respiratory distress.     Breath sounds: Normal breath sounds.  Abdominal:     General: Bowel sounds are normal. There is no distension.     Palpations: Abdomen is soft.     Tenderness: There is no guarding or rebound.  Genitourinary:    Vagina: No vaginal discharge.  Musculoskeletal:        General: Normal range of motion.     Cervical back: Normal range of motion and neck supple.  Skin:    General: Skin is warm and dry.     Capillary Refill: Capillary refill takes less than 2 seconds.     Findings: No erythema or rash.  Neurological:     General: No focal deficit present.  Mental Status: She is alert.     Deep Tendon Reflexes: Reflexes normal.  Psychiatric:        Mood and Affect: Mood normal.     ED Results / Procedures / Treatments   Labs (all labs ordered are listed, but only abnormal  Results for orders placed or performed during the hospital encounter of 04/13/23  CBC with Differential  Result Value Ref Range   WBC 9.0 4.0 - 10.5 K/uL   RBC 4.15 3.87 - 5.11 MIL/uL   Hemoglobin 12.0 12.0 - 15.0 g/dL   HCT 29.5 62.1 - 30.8 %   MCV 89.6 80.0 - 100.0 fL   MCH 28.9 26.0 - 34.0 pg   MCHC 32.3 30.0 - 36.0 g/dL   RDW 65.7 84.6 - 96.2 %   Platelets 285 150 - 400 K/uL   nRBC 0.0 0.0 - 0.2 %   Neutrophils Relative % 64 %   Neutro Abs 5.8 1.7 - 7.7 K/uL   Lymphocytes Relative 25 %   Lymphs Abs 2.2 0.7 - 4.0 K/uL   Monocytes  Relative 8 %   Monocytes Absolute 0.7 0.1 - 1.0 K/uL   Eosinophils Relative 2 %   Eosinophils Absolute 0.1 0.0 - 0.5 K/uL   Basophils Relative 1 %   Basophils Absolute 0.1 0.0 - 0.1 K/uL   Immature Granulocytes 0 %   Abs Immature Granulocytes 0.03 0.00 - 0.07 K/uL  Basic metabolic panel  Result Value Ref Range   Sodium 137 135 - 145 mmol/L   Potassium 3.7 3.5 - 5.1 mmol/L   Chloride 98 98 - 111 mmol/L   CO2 28 22 - 32 mmol/L   Glucose, Bld 137 (H) 70 - 99 mg/dL   BUN 17 8 - 23 mg/dL   Creatinine, Ser 9.52 0.44 - 1.00 mg/dL   Calcium 8.9 8.9 - 84.1 mg/dL   GFR, Estimated >32 >44 mL/min   Anion gap 11 5 - 15  Hepatic function panel  Result Value Ref Range   Total Protein 7.4 6.5 - 8.1 g/dL   Albumin 3.6 3.5 - 5.0 g/dL   AST 19 15 - 41 U/L   ALT 13 0 - 44 U/L   Alkaline Phosphatase 89 38 - 126 U/L   Total Bilirubin 0.3 0.3 - 1.2 mg/dL   Bilirubin, Direct <0.1 0.0 - 0.2 mg/dL   Indirect Bilirubin NOT CALCULATED 0.3 - 0.9 mg/dL  Lipase, blood  Result Value Ref Range   Lipase 40 11 - 51 U/L  Troponin I (High Sensitivity)  Result Value Ref Range   Troponin I (High Sensitivity) 3 <18 ng/L  Troponin I (High Sensitivity)  Result Value Ref Range   Troponin I (High Sensitivity) 3 <18 ng/L   CT Angio Chest/Abd/Pel for Dissection W and/or Wo Contrast  Result Date: 04/13/2023 CLINICAL DATA:  Right shoulder pain radiating to the right upper quadrant with painful respiration. EXAM: CT ANGIOGRAPHY CHEST, ABDOMEN AND PELVIS TECHNIQUE: Non-contrast CT of the chest was initially obtained. Multidetector CT imaging through the chest, abdomen and pelvis was performed using the standard protocol during bolus administration of intravenous contrast. Multiplanar reconstructed images and MIPs were obtained and reviewed to evaluate the vascular anatomy. RADIATION DOSE REDUCTION: This exam was performed according to the departmental dose-optimization program which includes automated exposure control,  adjustment of the mA and/or kV according to patient size and/or use of iterative reconstruction technique. CONTRAST:  OMNIPAQUE IOHEXOL 350 MG/ML SOLN COMPARISON:  Chest radiograph 04/13/2023 FINDINGS: CTA CHEST  FINDINGS Cardiovascular: Preferential opacification of the thoracic aorta. No evidence of thoracic aortic aneurysm or dissection. Normal heart size. No pericardial effusion. Mediastinum/Nodes: Trachea and esophagus are unremarkable. No thoracic adenopathy. Lungs/Pleura: Bibasilar atelectasis/scarring. No focal pneumonia. No pleural effusion or pneumothorax. Musculoskeletal: No acute fracture. Thoracic spondylosis with fusion of anterior osteophytes. Review of the MIP images confirms the above findings. CTA ABDOMEN AND PELVIS FINDINGS VASCULAR No aortic stenosis, aneurysm or dissection. The mesenteric and renal arteries are widely patent. Widely patent inflow and outflow arteries bilaterally. Review of the MIP images confirms the above findings. NON-VASCULAR Hepatobiliary: No focal liver abnormality is seen. No gallstones, gallbladder wall thickening, or biliary dilatation. Pancreas: Unremarkable. Spleen: Unremarkable. Adrenals/Urinary Tract: Normal adrenal glands. No urinary calculi or hydronephrosis. Unremarkable bladder. Stomach/Bowel: Stomach is within normal limits. Appendix appears normal. No evidence of bowel wall thickening, distention, or inflammatory changes. Lymphatic: No lymphadenopathy. Reproductive: Unremarkable. Other: Trace free fluid in the pelvis.  No free intraperitoneal air. Musculoskeletal: No acute fracture. Review of the MIP images confirms the above findings. IMPRESSION: 1. No evidence of aortic aneurysm or dissection. 2. No acute findings in the chest, abdomen, or pelvis. Electronically Signed   By: Minerva Fester M.D.   On: 04/13/2023 04:04   US Abdomen Limited  Result Date: 04/13/2023 CLINICAL DATA:  Right upper quadrant pain. EXAM: ULTRASOUND ABDOMEN LIMITED RIGHT UPPER  QUADRANT COMPARISON:  None Available. FINDINGS: Gallbladder: No gallstones or wall thickening visualized. No sonographic Murphy sign noted by sonographer. Common bile duct: Diameter: 4.3 mm.  No intrahepatic biliary prominence. Liver: No focal lesion identified. Within normal limits in parenchymal echogenicity. Portal vein is patent on color Doppler imaging with normal direction of blood flow towards the liver. Other: None. IMPRESSION: Negative right upper quadrant ultrasound. Electronically Signed   By: Almira Bar M.D.   On: 04/13/2023 02:56   DG Chest Portable 1 View  Result Date: 04/13/2023 CLINICAL DATA:  Right-sided neck pain radiating down to shoulder EXAM: PORTABLE CHEST 1 VIEW COMPARISON:  Radiographs 08/26/2018 FINDINGS: Stable cardiomediastinal silhouette. Aortic atherosclerotic calcification. No focal consolidation, pleural effusion, or pneumothorax. No displaced rib fractures. IMPRESSION: No active disease. Electronically Signed   By: Minerva Fester M.D.   On: 04/13/2023 02:11   MR Brain W Wo Contrast  Result Date: 03/28/2023 CLINICAL DATA:  Hearing loss on the right for a few years. Vestibular schwannoma EXAM: MRI HEAD WITHOUT AND WITH CONTRAST TECHNIQUE: Multiplanar, multiecho pulse sequences of the brain and surrounding structures were obtained without and with intravenous contrast. CONTRAST:  6mL GADAVIST GADOBUTROL 1 MMOL/ML IV SOLN COMPARISON:  10/26/2020 FINDINGS: Brain: 7 x 4 mm homogeneously enhancing mass at the right internal auditory canal consistent with history of vestibular schwannoma, purely intracanalicular. Unremarkable labyrinth. Normal appearance of the brainstem and cisterns. Brain volume is normal for age. Age normal white matter appearance with no acute infarct, hemorrhage, hydrocephalus, or extra-axial collection. Vascular: Major flow voids and vascular enhancements are preserved. Skull and upper cervical spine: Normal marrow signal Sinuses/Orbits: Negative Other:  Bilateral parotid cysts and scarring correlating with history of Sjogren's disease IMPRESSION: 1. Unchanged 7 x 4 mm vestibular schwannoma in the right internal auditory canal. 2. No new finding. Electronically Signed   By: Tiburcio Pea M.D.   On: 03/28/2023 06:04    EKG NSR 64 BPM with LAD   Radiology CT Angio Chest/Abd/Pel for Dissection W and/or Wo Contrast  Result Date: 04/13/2023 CLINICAL DATA:  Right shoulder pain radiating to the right upper quadrant with painful  respiration. EXAM: CT ANGIOGRAPHY CHEST, ABDOMEN AND PELVIS TECHNIQUE: Non-contrast CT of the chest was initially obtained. Multidetector CT imaging through the chest, abdomen and pelvis was performed using the standard protocol during bolus administration of intravenous contrast. Multiplanar reconstructed images and MIPs were obtained and reviewed to evaluate the vascular anatomy. RADIATION DOSE REDUCTION: This exam was performed according to the departmental dose-optimization program which includes automated exposure control, adjustment of the mA and/or kV according to patient size and/or use of iterative reconstruction technique. CONTRAST:  OMNIPAQUE IOHEXOL 350 MG/ML SOLN COMPARISON:  Chest radiograph 04/13/2023 FINDINGS: CTA CHEST FINDINGS Cardiovascular: Preferential opacification of the thoracic aorta. No evidence of thoracic aortic aneurysm or dissection. Normal heart size. No pericardial effusion. Mediastinum/Nodes: Trachea and esophagus are unremarkable. No thoracic adenopathy. Lungs/Pleura: Bibasilar atelectasis/scarring. No focal pneumonia. No pleural effusion or pneumothorax. Musculoskeletal: No acute fracture. Thoracic spondylosis with fusion of anterior osteophytes. Review of the MIP images confirms the above findings. CTA ABDOMEN AND PELVIS FINDINGS VASCULAR No aortic stenosis, aneurysm or dissection. The mesenteric and renal arteries are widely patent. Widely patent inflow and outflow arteries bilaterally. Review of  the MIP images confirms the above findings. NON-VASCULAR Hepatobiliary: No focal liver abnormality is seen. No gallstones, gallbladder wall thickening, or biliary dilatation. Pancreas: Unremarkable. Spleen: Unremarkable. Adrenals/Urinary Tract: Normal adrenal glands. No urinary calculi or hydronephrosis. Unremarkable bladder. Stomach/Bowel: Stomach is within normal limits. Appendix appears normal. No evidence of bowel wall thickening, distention, or inflammatory changes. Lymphatic: No lymphadenopathy. Reproductive: Unremarkable. Other: Trace free fluid in the pelvis.  No free intraperitoneal air. Musculoskeletal: No acute fracture. Review of the MIP images confirms the above findings. IMPRESSION: 1. No evidence of aortic aneurysm or dissection. 2. No acute findings in the chest, abdomen, or pelvis. Electronically Signed   By: Minerva Fester M.D.   On: 04/13/2023 04:04   US Abdomen Limited  Result Date: 04/13/2023 CLINICAL DATA:  Right upper quadrant pain. EXAM: ULTRASOUND ABDOMEN LIMITED RIGHT UPPER QUADRANT COMPARISON:  None Available. FINDINGS: Gallbladder: No gallstones or wall thickening visualized. No sonographic Murphy sign noted by sonographer. Common bile duct: Diameter: 4.3 mm.  No intrahepatic biliary prominence. Liver: No focal lesion identified. Within normal limits in parenchymal echogenicity. Portal vein is patent on color Doppler imaging with normal direction of blood flow towards the liver. Other: None. IMPRESSION: Negative right upper quadrant ultrasound. Electronically Signed   By: Almira Bar M.D.   On: 04/13/2023 02:56   DG Chest Portable 1 View  Result Date: 04/13/2023 CLINICAL DATA:  Right-sided neck pain radiating down to shoulder EXAM: PORTABLE CHEST 1 VIEW COMPARISON:  Radiographs 08/26/2018 FINDINGS: Stable cardiomediastinal silhouette. Aortic atherosclerotic calcification. No focal consolidation, pleural effusion, or pneumothorax. No displaced rib fractures. IMPRESSION: No  active disease. Electronically Signed   By: Minerva Fester M.D.   On: 04/13/2023 02:11    Procedures Procedures    Medications Ordered in ED Medications  fentaNYL (SUBLIMAZE) injection 25 mcg (25 mcg Intravenous Given 04/13/23 0329)  iohexol (OMNIPAQUE) 350 MG/ML injection 100 mL (100 mLs Intravenous Contrast Given 04/13/23 0347)  ketorolac (TORADOL) 30 MG/ML injection 30 mg (30 mg Intravenous Given 04/13/23 0441)    ED Course/ Medical Decision Making/ A&P                                 Medical Decision Making Pain in bed, ate a sausage and egg casserole earlier in the evening.  No trauma  Amount and/or Complexity of Data Reviewed Independent Historian: spouse    Details: See above  External Data Reviewed: notes.    Details: Previous notes reviewed  Labs: ordered.    Details: Normal LFTS.  Normal troponins x 2 3/3.  Normal sodium 137, normal potassium 3.7, normal creatinine.  Normal white count 9, normal hemoglobin 12, normal platelets  Radiology: ordered and independent interpretation performed. Decision-making details documented in ED Course.    Details: Normal CXR.  Normal CTA by me   Risk Prescription drug management. Risk Details: Pain free and ruled out for MI and dissection in the ED.  No cholelithiasis on [place].  Very well appearing.  Likely MSK.  Stable for discharge.      Final Clinical Impression(s) / ED Diagnoses Final diagnoses:  Side pain  Return for intractable cough, coughing up blood, fevers > 100.4 unrelieved by medication, shortness of breath, intractable vomiting, chest pain, shortness of breath, weakness, numbness, changes in speech, facial asymmetry, abdominal pain, passing out, Inability to tolerate liquids or food, cough, altered mental status or any concerns. No signs of systemic illness or infection. The patient is nontoxic-appearing on exam and vital signs are within normal limits.  I have reviewed the triage vital signs and the nursing notes. Pertinent  labs & imaging results that were available during my care of the patient were reviewed by me and considered in my medical decision making (see chart for details). After history, exam, and medical workup I feel the patient has been appropriately medically screened and is safe for discharge home. Pertinent diagnoses were discussed with the patient. Patient was given return precautions.   Rx / DC Orders ED Discharge Orders     None         Jasani Dolney, MD 04/13/23 [id number]

## 2023-04-13 NOTE — Assessment & Plan Note (Addendum)
New. Did test positive for Strep throat in office today. Will initiate amoxicillin.

## 2023-04-13 NOTE — Progress Notes (Signed)
Subjective:     Patient ID: Kaitlyn Hanna, female    DOB: Jul 09, 1941, 82 y.o.   MRN: [id number]  No chief complaint on file.   HPI  Discussed the use of AI scribe software for clinical note transcription with the patient, who gave verbal consent to proceed.  History of Present Illness         Patient presents today for complete physical.  Immunizations: Diet: Exercise: Colonoscopy: Dexa: Pap Smear: Mammogram:     Health Maintenance Due  Topic Date Due   Zoster Vaccines- Shingrix (1 of 2) Never done   Medicare Annual Wellness (AWV)  10/24/2021   COVID-19 Vaccine (4 - 2023-24 season) 04/24/2022   INFLUENZA VACCINE  03/25/2023    Past Medical History:  Diagnosis Date   Benign schwannoma 06/30/2016   Goals of care, counseling/discussion 03/03/2021   H/O measles    Hearing loss in right ear 04/14/2016   MGUS (monoclonal gammopathy of unknown significance) 03/03/2021   Osteoporosis 04/14/2016   Raynaud's phenomenon 04/14/2016   Schwannoma of cranial nerve (HCC) 06/30/2016    Past Surgical History:  Procedure Laterality Date   ABDOMINAL HYSTERECTOMY     age 4 TAH b/l SPO for endometriosis   TONSILLECTOMY     WISDOM TOOTH EXTRACTION      Family History  Problem Relation Age of Onset   Heart disease Mother        MI   Cancer Father        lung cancer, small cell, smoker   Hypertension Sister    Dementia Sister    Alcohol abuse Brother    Cancer Maternal Grandmother    Dementia Sister    Arthritis Sister        Rheumtoid    Alzheimer's disease Sister    Cancer Brother     Social History   Socioeconomic History   Marital status: Married    Spouse name: Not on file   Number of children: Not on file   Years of education: Not on file   Highest education level: Not on file  Occupational History   Not on file  Tobacco Use   Smoking status: Never   Smokeless tobacco: Never  Vaping Use   Vaping status: Never Used  Substance and Sexual Activity    Alcohol use: No   Drug use: No   Sexual activity: Yes  Other Topics Concern   Not on file  Social History Narrative   Lives with husband   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 Walks and Y deep water exercise   Raised 2 stepkids, son and daughter   Social Determinants of Health   Financial Resource Strain: Low Risk  (10/24/2020)   Overall Financial Resource Strain (CARDIA)    Difficulty of Paying Living Expenses: Not hard at all  Food Insecurity: No Food Insecurity (10/24/2020)   Hunger Vital Sign    Worried About Running Out of Food in the Last Year: Never true    Ran Out of Food in the Last Year: Never true  Transportation Needs: No Transportation Needs (10/24/2020)   PRAPARE - Administrator, Civil Service (Medical): No    Lack of Transportation (Non-Medical): No  Physical Activity: Insufficiently Active (10/24/2020)   Exercise Vital Sign    Days of Exercise per Week: 3 days    Minutes of Exercise per Session: 20 min  Stress: No Stress Concern Present (  10/24/2020)   [employer] [employer] - Occupational Stress Questionnaire    Feeling of Stress : Not at all  Social Connections: Moderately Integrated (10/24/2020)   Social Connection and Isolation Panel [NHANES]    Frequency of Communication with Friends and Family: More than three times a week    Frequency of Social Gatherings with Friends and Family: More than three times a week    Attends Religious Services: More than 4 times per year    Active Member of [employer] or Organizations: No    Attends Banker Meetings: Never    Marital Status: Married  Catering manager Violence: Not At Risk (10/24/2020)   Humiliation, Afraid, Rape, and Kick questionnaire    Fear of Current or Ex-Partner: No    Emotionally Abused: No    Physically Abused: No    Sexually Abused: No    Outpatient Medications Prior to Visit  Medication Sig Dispense Refill    Flaxseed, Linseed, (FLAXSEED OIL PO) Take by mouth daily.     Multiple Vitamins-Minerals (MULTIVITAMIN WITH MINERALS) tablet Take 1 tablet by mouth daily.     Multiple Vitamins-Minerals (VITAMINS/MINERALS PO) as directed Orally     Omega-3 Fatty Acids (FISH OIL PO) Take by mouth daily.     No facility-administered medications prior to visit.    No Known Allergies  ROS     Objective:    Physical Exam   LMP  (LMP Unknown)  Wt Readings from Last 3 Encounters:  03/23/23 133 lb 6.4 oz (60.5 kg)  09/22/22 136 lb (61.7 kg)  08/11/22 133 lb (60.3 kg)       Assessment & Plan:   Problem List Items Addressed This Visit   None   I am having Kaitlyn Hanna maintain her multivitamin with minerals, (Flaxseed, Linseed, (FLAXSEED OIL PO)), Omega-3 Fatty Acids (FISH OIL PO), and Multiple Vitamins-Minerals (VITAMINS/MINERALS PO).  No orders of the defined types were placed in this encounter.

## 2023-04-13 NOTE — Progress Notes (Signed)
Subjective:     Patient ID: Kaitlyn Hanna, female    DOB: Aug 19, 1941, 82 y.o.   MRN: [id number]  Chief Complaint  Patient presents with   Eye Drainage    Complains of eye left eye irritation with some drainage   Throat irritation    Patient complains of throat "irritation" started sunday   Insect Bite    Patient reports having bug bites on her legs    HPI  Discussed the use of AI scribe software for clinical note transcription with the patient, who gave verbal consent to proceed.  82 year old female presents to the clinic today for a sore throat, left eye drainage and some possible bug bites on her left thigh. She states that the throat irritation started on Sunday, she denies any pain with her throat. She was mainly concerned with having Covid. No fever noted this office visit. The left eye drainage started about 2 weeks ago per patient and she states that is has some drainage in the morning and that it feels like a piece of film is in her eye. No pain noted in the eye. She does report that she was recently started on restasis by her eye doctor and wonders if she is allergic to the Restasis.  With the bites that her on her thigh she states she was outside this past weekend helping her daughter move some tree branches and she noticed some red irritated areas to her left upper thigh. She denies then itching she states that it is like a stinging sensation. She states she has used calamine lotion, cold compress, and neosporin but nothing has helped.       Health Maintenance Due  Topic Date Due   Zoster Vaccines- Shingrix (1 of 2) Never done   Medicare Annual Wellness (AWV)  10/24/2021   COVID-19 Vaccine (4 - 2023-24 season) 04/24/2022   INFLUENZA VACCINE  03/25/2023    Past Medical History:  Diagnosis Date   Benign schwannoma 06/30/2016   Goals of care, counseling/discussion 03/03/2021   H/O measles    Hearing loss in right ear 04/14/2016   MGUS (monoclonal gammopathy of unknown  significance) 03/03/2021   Osteoporosis 04/14/2016   Raynaud's phenomenon 04/14/2016   Schwannoma of cranial nerve (HCC) 06/30/2016    Past Surgical History:  Procedure Laterality Date   ABDOMINAL HYSTERECTOMY     age 76 TAH b/l SPO for endometriosis   TONSILLECTOMY     WISDOM TOOTH EXTRACTION      Family History  Problem Relation Age of Onset   Heart disease Mother        MI   Cancer Father        lung cancer, small cell, smoker   Hypertension Sister    Dementia Sister    Alcohol abuse Brother    Cancer Maternal Grandmother    Dementia Sister    Arthritis Sister        Rheumtoid    Alzheimer's disease Sister    Cancer Brother     Social History   Socioeconomic History   Marital status: Married    Spouse name: Not on file   Number of children: Not on file   Years of education: Not on file   Highest education level: Not on file  Occupational History   Not on file  Tobacco Use   Smoking status: Never   Smokeless tobacco: Never  Vaping Use   Vaping status: Never Used  Substance and Sexual Activity  Alcohol use: No   Drug use: No   Sexual activity: Yes  Other Topics Concern   Not on file  Social History Narrative   Lives with husband   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 Walks and Y deep water exercise   Raised 2 stepkids, son and daughter   Social Determinants of Health   Financial Resource Strain: Low Risk  (10/24/2020)   Overall Financial Resource Strain (CARDIA)    Difficulty of Paying Living Expenses: Not hard at all  Food Insecurity: No Food Insecurity (10/24/2020)   Hunger Vital Sign    Worried About Running Out of Food in the Last Year: Never true    Ran Out of Food in the Last Year: Never true  Transportation Needs: No Transportation Needs (10/24/2020)   PRAPARE - Administrator, Civil Service (Medical): No    Lack of Transportation (Non-Medical): No  Physical Activity:  Insufficiently Active (10/24/2020)   Exercise Vital Sign    Days of Exercise per Week: 3 days    Minutes of Exercise per Session: 20 min  Stress: No Stress Concern Present (10/24/2020)   [employer] of Occupational Health - Occupational Stress Questionnaire    Feeling of Stress : Not at all  Social Connections: Moderately Integrated (10/24/2020)   Social Connection and Isolation Panel [NHANES]    Frequency of Communication with Friends and Family: More than three times a week    Frequency of Social Gatherings with Friends and Family: More than three times a week    Attends Religious Services: More than 4 times per year    Active Member of [employer] or Organizations: No    Attends Banker Meetings: Never    Marital Status: Married  Catering manager Violence: Not At Risk (10/24/2020)   Humiliation, Afraid, Rape, and Kick questionnaire    Fear of Current or Ex-Partner: No    Emotionally Abused: No    Physically Abused: No    Sexually Abused: No    Outpatient Medications Prior to Visit  Medication Sig Dispense Refill   Flaxseed, Linseed, (FLAXSEED OIL PO) Take by mouth daily.     Multiple Vitamins-Minerals (MULTIVITAMIN WITH MINERALS) tablet Take 1 tablet by mouth daily.     Multiple Vitamins-Minerals (VITAMINS/MINERALS PO) as directed Orally     Omega-3 Fatty Acids (FISH OIL PO) Take by mouth daily.     No facility-administered medications prior to visit.    No Known Allergies  Review of Systems  Constitutional:  Negative for chills and fever.  HENT:  Positive for sore throat. Negative for hearing loss, sinus pain and tinnitus.   Eyes:  Positive for blurred vision (left eye), discharge (left eye started 2 weeks ago) and redness (left eye).  Cardiovascular:  Negative for chest pain.  Musculoskeletal:  Negative for myalgias.  Neurological:  Negative for dizziness, sensory change, speech change and headaches.  Psychiatric/Behavioral:  Negative for depression and suicidal  ideas.        Objective:    Physical Exam Constitutional:      Appearance: Normal appearance. She is normal weight.  HENT:     Head: Normocephalic.     Nose: Nose normal.     Mouth/Throat:     Lips: Pink.     Pharynx: Uvula midline. Posterior oropharyngeal erythema (noted on left and right side on tonsils) present.     Comments: Erythema noted in left and right tonsils  Eyes:     General:        Left eye: Discharge present.    Comments: Redness and some drainage noted in left eye   Cardiovascular:     Rate and Rhythm: Normal rate and regular rhythm.     Heart sounds: Normal heart sounds.  Pulmonary:     Effort: Pulmonary effort is normal.     Breath sounds: Normal breath sounds.  Musculoskeletal:     Cervical back: Normal range of motion.  Skin:    General: Skin is warm.     Comments: Scattered erythematous papules noted left upper anterior thigh  Neurological:     General: No focal deficit present.     Mental Status: She is alert and oriented to person, place, and time. Mental status is at baseline.  Psychiatric:        Mood and Affect: Mood normal.        Behavior: Behavior normal.        Thought Content: Thought content normal.      BP 118/74 (BP Location: Right Arm, Patient Position: Sitting, Cuff Size: Small)   Pulse 77   Temp 98.4 F (36.9 C) (Oral)   Wt 133 lb (60.3 kg)   LMP  (LMP Unknown)   SpO2 99%   BMI 25.97 kg/m  Wt Readings from Last 3 Encounters:  04/13/23 133 lb (60.3 kg)  03/23/23 133 lb 6.4 oz (60.5 kg)  09/22/22 136 lb (61.7 kg)       Assessment & Plan:   Problem List Items Addressed This Visit       Unprioritized   Strep throat    New. Did test positive for Strep throat in office today. Will initiate amoxicillin.       Insect bite of left thigh   Relevant Medications   betamethasone valerate ointment (VALISONE) 0.1 %   Acute bacterial conjunctivitis of both eyes - Primary    New. Suspect conjunctivitis due to strep. Rx with  cortisporin.  If this doesn't help, we discussed that it may be the restasis causing her irritation.       Relevant Medications   neomycin-polymyxin-hydrocortisone (CORTISPORIN) 3.5-10000-1 ophthalmic suspension   Other Visit Diagnoses     Sore throat       Relevant Orders   POCT rapid strep A (Completed)   POC COVID-19 (Completed)       I am having Hampton Abbot. Abernathy start on amoxicillin, neomycin-polymyxin-hydrocortisone, and betamethasone valerate ointment. I am also having her maintain her multivitamin with minerals, (Flaxseed, Linseed, (FLAXSEED OIL PO)), Omega-3 Fatty Acids (FISH OIL PO), and Multiple Vitamins-Minerals (VITAMINS/MINERALS PO).  Meds ordered this encounter  Medications   amoxicillin (AMOXIL) 500 MG capsule    Sig: Take 1 capsule (500 mg total) by mouth 3 (three) times daily for 10 days.    Dispense:  30 capsule    Refill:  0   neomycin-polymyxin-hydrocortisone (CORTISPORIN) 3.5-10000-1 ophthalmic suspension    Sig: Place 3 drops into both eyes 3 (three) times daily for 5 days.    Dispense:  7.5 mL    Refill:  0    Order Specific Question:   Supervising Provider    Answer:   Danise Edge A [[id number]]   betamethasone valerate ointment (VALISONE) 0.1 %    Sig: Apply 1 Application topically 2 (two) times daily.    Dispense:  30 g    Refill:  0    Order Specific Question:   Supervising Provider  Answer:   Danise Edge A [id number]

## 2023-04-22 DIAGNOSIS — D333 Benign neoplasm of cranial nerves: Secondary | ICD-10-CM | POA: Diagnosis not present

## 2023-04-22 DIAGNOSIS — H903 Sensorineural hearing loss, bilateral: Secondary | ICD-10-CM | POA: Diagnosis not present

## 2023-07-14 ENCOUNTER — Encounter: Payer: Self-pay | Admitting: Physician Assistant

## 2023-07-14 ENCOUNTER — Ambulatory Visit (INDEPENDENT_AMBULATORY_CARE_PROVIDER_SITE_OTHER): Payer: Medicare Other | Admitting: Physician Assistant

## 2023-07-14 VITALS — BP 117/80 | HR 76 | Temp 98.1°F | Resp 18 | Ht 60.0 in | Wt 137.6 lb

## 2023-07-14 DIAGNOSIS — R739 Hyperglycemia, unspecified: Secondary | ICD-10-CM

## 2023-07-14 DIAGNOSIS — R531 Weakness: Secondary | ICD-10-CM

## 2023-07-14 DIAGNOSIS — J029 Acute pharyngitis, unspecified: Secondary | ICD-10-CM

## 2023-07-14 LAB — COMPREHENSIVE METABOLIC PANEL
ALT: 12 U/L (ref 0–35)
AST: 20 U/L (ref 0–37)
Albumin: 4.2 g/dL (ref 3.5–5.2)
Alkaline Phosphatase: 88 U/L (ref 39–117)
BUN: 22 mg/dL (ref 6–23)
CO2: 29 meq/L (ref 19–32)
Calcium: 9.1 mg/dL (ref 8.4–10.5)
Chloride: 101 meq/L (ref 96–112)
Creatinine, Ser: 0.86 mg/dL (ref 0.40–1.20)
GFR: 63.76 mL/min (ref >60.00)
Glucose, Bld: 92 mg/dL (ref 70–99)
Potassium: 4.6 meq/L (ref 3.5–5.1)
Sodium: 136 meq/L (ref 135–145)
Total Bilirubin: 0.5 mg/dL (ref 0.2–1.2)
Total Protein: 7.2 g/dL (ref 6.0–8.3)

## 2023-07-14 LAB — CBC WITH DIFFERENTIAL/PLATELET
Basophils Absolute: 0 10*3/uL (ref 0.0–0.1)
Basophils Relative: 0.7 % (ref 0.0–3.0)
Eosinophils Absolute: 0.1 10*3/uL (ref 0.0–0.7)
Eosinophils Relative: 1.5 % (ref 0.0–5.0)
HCT: 37 % (ref 36.0–46.0)
Hemoglobin: 12.2 g/dL (ref 12.0–15.0)
Lymphocytes Relative: 24.7 % (ref 12.0–46.0)
Lymphs Abs: 1.8 10*3/uL (ref 0.7–4.0)
MCHC: 33.1 g/dL (ref 30.0–36.0)
MCV: 89 fL (ref 78.0–100.0)
Monocytes Absolute: 0.6 10*3/uL (ref 0.1–1.0)
Monocytes Relative: 8.7 % (ref 3.0–12.0)
Neutro Abs: 4.6 10*3/uL (ref 1.4–7.7)
Neutrophils Relative %: 64.4 % (ref 43.0–77.0)
Platelets: 263 10*3/uL (ref 150.0–400.0)
RBC: 4.16 Mil/uL (ref 3.87–5.11)
RDW: 14.4 % (ref 11.5–15.5)
WBC: 7.1 10*3/uL (ref 4.0–10.5)

## 2023-07-14 LAB — POCT RAPID STREP A (OFFICE): Rapid Strep A Screen: NEGATIVE

## 2023-07-14 LAB — HEMOGLOBIN A1C: Hgb A1c MFr Bld: 6 % (ref 4.6–6.5)

## 2023-07-14 NOTE — Progress Notes (Signed)
Established patient visit   Patient: Kaitlyn Hanna   DOB: 1941-07-30   82 y.o. Female  MRN: [id number] Visit Date: 07/14/2023  Today's healthcare provider: Alfredia Ferguson, PA-C   Chief Complaint  Patient presents with   Sore Throat    Pt states her throat feels "different", weak. Pt would like blood work done  Pt states nose was sore on L side for several days, pain has subsided    Subjective     Pt reports a sore throat, some slight R ear pain, and yesterday her glands on the right side of her neck were swollen. Resolved today. Some rhinorrhea, denies cough, fevers.  Medications: Outpatient Medications Prior to Visit  Medication Sig   betamethasone valerate ointment (VALISONE) 0.1 % Apply 1 Application topically 2 (two) times daily.   Flaxseed, Linseed, (FLAXSEED OIL PO) Take by mouth daily.   Multiple Vitamins-Minerals (MULTIVITAMIN WITH MINERALS) tablet Take 1 tablet by mouth daily.   Multiple Vitamins-Minerals (VITAMINS/MINERALS PO) as directed Orally   Omega-3 Fatty Acids (FISH OIL PO) Take by mouth daily.   No facility-administered medications prior to visit.    Review of Systems  Constitutional:  Negative for fatigue and fever.  Respiratory:  Negative for cough and shortness of breath.   Cardiovascular:  Negative for chest pain and leg swelling.  Gastrointestinal:  Negative for abdominal pain.  Neurological:  Negative for dizziness and headaches.       Objective    BP 117/80 (BP Location: Left Arm, Patient Position: Sitting, Cuff Size: Normal)   Pulse 76   Temp 98.1 F (36.7 C) (Oral)   Resp 18   Ht 5' (1.524 m)   Wt 137 lb 9.6 oz (62.4 kg)   LMP  (LMP Unknown)   SpO2 96%   BMI 26.87 kg/m    Physical Exam Constitutional:      General: She is awake.     Appearance: She is well-developed.  HENT:     Head: Normocephalic.     Right Ear: Tympanic membrane normal.     Left Ear: Tympanic membrane normal.     Mouth/Throat:     Pharynx: Posterior  oropharyngeal erythema present. No oropharyngeal exudate.  Eyes:     Conjunctiva/sclera: Conjunctivae normal.  Cardiovascular:     Rate and Rhythm: Normal rate and regular rhythm.     Heart sounds: Normal heart sounds.  Pulmonary:     Effort: Pulmonary effort is normal.     Breath sounds: Normal breath sounds.  Skin:    General: Skin is warm.  Neurological:     Mental Status: She is alert and oriented to person, place, and time.  Psychiatric:        Attention and Perception: Attention normal.        Mood and Affect: Mood normal.        Speech: Speech normal.        Behavior: Behavior is cooperative.      No results found for any visits on 07/14/23.  Assessment & Plan    Acute pharyngitis, unspecified etiology  Sore throat -     POCT rapid strep A  Weakness -     CBC with Differential/Platelet -     Comprehensive metabolic panel  Hyperglycemia -     Hemoglobin A1c  Poc strep negative. Likely viral etiology.  Advised hydration, rest, tylenol/ibuprofen. Pt concerned about the level of fatigue/weakness, will run cbc,cmp, A1c-- h/o hyperglycemia/predm.  Return if symptoms worsen  or fail to improve.       Alfredia Ferguson, PA-C  [facility] [phone] (phone) [phone] (fax)  [facility]

## 2023-08-16 ENCOUNTER — Ambulatory Visit (INDEPENDENT_AMBULATORY_CARE_PROVIDER_SITE_OTHER): Payer: Medicare Other | Admitting: Family Medicine

## 2023-08-16 ENCOUNTER — Encounter: Payer: Self-pay | Admitting: Family Medicine

## 2023-08-16 ENCOUNTER — Encounter: Payer: Medicare Other | Admitting: Family Medicine

## 2023-08-16 VITALS — BP 130/70 | HR 68 | Ht 60.0 in | Wt 138.0 lb

## 2023-08-16 DIAGNOSIS — Z0000 Encounter for general adult medical examination without abnormal findings: Secondary | ICD-10-CM | POA: Diagnosis not present

## 2023-08-16 NOTE — Progress Notes (Signed)
Complete physical exam  Patient: Kaitlyn Hanna   DOB: March 14, 1941   82 y.o. Female  MRN: [id number]  Subjective:    Chief Complaint  Patient presents with   Annual Exam    Kaitlyn Hanna is a 82 y.o. female who presents today for a complete physical exam. She reports consuming a general diet. Home exercise routine includes walking 3 days per week. She generally feels well. She reports sleeping well. She does not have additional problems to discuss today.   Currently lives with: husband  Acute concerns or interim problems since last visit: no  Vision concerns: no Dental concerns: no  ETOH use: no Nicotine use: no Recreational drugs/illegal substances: no     Most recent fall risk assessment:    08/16/2023    9:42 AM  Fall Risk   Falls in the past year? 0  Number falls in past yr: 0  Injury with Fall? 0  Risk for fall due to : No Fall Risks  Follow up Falls evaluation completed     Most recent depression screenings:    08/16/2023    9:42 AM 08/11/2022    2:26 PM  PHQ 2/9 Scores  PHQ - 2 Score 0 0  PHQ- 9 Score 0 0            Patient Care Team: Bradd Canary, MD as PCP - General (Family Medicine) Myna Hidalgo Rose Phi, MD as Consulting Physician (Oncology)   Outpatient Medications Prior to Visit  Medication Sig   Flaxseed, Linseed, (FLAXSEED OIL PO) Take by mouth daily.   Multiple Vitamins-Minerals (MULTIVITAMIN WITH MINERALS) tablet Take 1 tablet by mouth daily.   Multiple Vitamins-Minerals (VITAMINS/MINERALS PO) as directed Orally   Omega-3 Fatty Acids (FISH OIL PO) Take by mouth daily.   [DISCONTINUED] betamethasone valerate ointment (VALISONE) 0.1 % Apply 1 Application topically 2 (two) times daily.   No facility-administered medications prior to visit.    ROS  All review of systems negative except what is listed in the HPI       Objective:     BP 130/70   Pulse 68   Ht 5' (1.524 m)   Wt 138 lb (62.6 kg)   LMP  (LMP Unknown)   BMI  26.95 kg/m    Physical Exam Vitals reviewed.  Constitutional:      General: She is not in acute distress.    Appearance: Normal appearance. She is not ill-appearing.  HENT:     Head: Normocephalic and atraumatic.  Eyes:     Extraocular Movements: Extraocular movements intact.     Conjunctiva/sclera: Conjunctivae normal.     Pupils: Pupils are equal, round, and reactive to light.  Neck:     Vascular: No carotid bruit.  Cardiovascular:     Rate and Rhythm: Normal rate and regular rhythm.     Pulses: Normal pulses.     Heart sounds: Normal heart sounds.  Pulmonary:     Effort: Pulmonary effort is normal.     Breath sounds: Normal breath sounds.  Abdominal:     General: Abdomen is flat. Bowel sounds are normal. There is no distension.     Palpations: Abdomen is soft. There is no mass.     Tenderness: There is no abdominal tenderness. There is no right CVA tenderness, left CVA tenderness, guarding or rebound.  Genitourinary:    Comments: Deferred exam Musculoskeletal:        General: Normal range of motion.     Cervical  back: Normal range of motion and neck supple. No tenderness.     Right lower leg: No edema.     Left lower leg: No edema.  Lymphadenopathy:     Cervical: No cervical adenopathy.  Skin:    General: Skin is warm and dry.     Capillary Refill: Capillary refill takes less than 2 seconds.  Neurological:     General: No focal deficit present.     Mental Status: She is alert and oriented to person, place, and time. Mental status is at baseline.  Psychiatric:        Mood and Affect: Mood normal.        Behavior: Behavior normal.        Thought Content: Thought content normal.        Judgment: Judgment normal.         No results found for any visits on 08/16/23.     Assessment & Plan:    Routine Health Maintenance and Physical Exam Discussed health promotion and safety including diet and exercise recommendations, dental health, and injury prevention.  Tobacco cessation if applicable. Seat belts, sunscreen, smoke detectors, etc.    Immunization History  Administered Date(s) Administered   Influenza, High Dose Seasonal PF 05/29/2017   Influenza-Unspecified 06/22/2016, 05/24/2018, 05/24/2020   PFIZER(Purple Top)SARS-COV-2 Vaccination 09/12/2019, 10/02/2019, 07/23/2020   Pneumococcal Conjugate-13 09/10/2014   Pneumococcal Polysaccharide-23 06/14/2008   Tdap 03/20/2014    Health Maintenance  Topic Date Due   Zoster Vaccines- Shingrix (1 of 2) Never done   Medicare Annual Wellness (AWV)  10/24/2021   COVID-19 Vaccine (4 - 2024-25 season) 09/01/2023 (Originally 04/25/2023)   INFLUENZA VACCINE  11/22/2023 (Originally 03/25/2023)   MAMMOGRAM  09/08/2023   DTaP/Tdap/Td (2 - Td or Tdap) 03/20/2024   Pneumonia Vaccine 38+ Years old  Completed   DEXA SCAN  Completed   HPV VACCINES  Aged Out        Problem List Items Addressed This Visit   None Visit Diagnoses       Annual physical exam    -  Primary Patient has been doing well. No acute concerns. Safety and health promotion discussed. CBC, CMP, A1c stable last month. She is due for further labs with Dr. Myna Hidalgo in a few months and declines other labs today.        Return in about 1 year (around 08/15/2024) for physical.     Clayborne Dana, NP

## 2023-08-25 DIAGNOSIS — H18523 Epithelial (juvenile) corneal dystrophy, bilateral: Secondary | ICD-10-CM

## 2023-08-25 DIAGNOSIS — H5203 Hypermetropia, bilateral: Secondary | ICD-10-CM

## 2023-08-25 DIAGNOSIS — H26492 Other secondary cataract, left eye: Secondary | ICD-10-CM

## 2023-08-25 DIAGNOSIS — H35373 Puckering of macula, bilateral: Secondary | ICD-10-CM

## 2023-08-25 DIAGNOSIS — H43813 Vitreous degeneration, bilateral: Secondary | ICD-10-CM

## 2023-08-25 HISTORY — DX: Puckering of macula, bilateral: H35.373

## 2023-08-25 HISTORY — DX: Epithelial (juvenile) corneal dystrophy, bilateral: H18.523

## 2023-08-25 HISTORY — DX: Vitreous degeneration, bilateral: H43.813

## 2023-08-25 HISTORY — DX: Other secondary cataract, left eye: H26.492

## 2023-08-25 HISTORY — DX: Hypermetropia, bilateral: H52.03

## 2023-10-05 ENCOUNTER — Other Ambulatory Visit (HOSPITAL_BASED_OUTPATIENT_CLINIC_OR_DEPARTMENT_OTHER): Payer: Self-pay | Admitting: Family Medicine

## 2023-10-05 DIAGNOSIS — Z139 Encounter for screening, unspecified: Secondary | ICD-10-CM

## 2023-10-19 ENCOUNTER — Ambulatory Visit (HOSPITAL_BASED_OUTPATIENT_CLINIC_OR_DEPARTMENT_OTHER)
Admission: RE | Admit: 2023-10-19 | Discharge: 2023-10-19 | Disposition: A | Discharge status: Home / Self Care | Payer: Medicare Other | Source: Ambulatory Visit | Attending: Family Medicine | Admitting: Family Medicine

## 2023-10-19 ENCOUNTER — Encounter (HOSPITAL_BASED_OUTPATIENT_CLINIC_OR_DEPARTMENT_OTHER): Payer: Self-pay

## 2023-10-19 DIAGNOSIS — Z139 Encounter for screening, unspecified: Secondary | ICD-10-CM

## 2023-10-19 DIAGNOSIS — Z1231 Encounter for screening mammogram for malignant neoplasm of breast: Secondary | ICD-10-CM | POA: Diagnosis not present

## 2023-10-27 ENCOUNTER — Ambulatory Visit: Admitting: Physician Assistant

## 2023-10-27 ENCOUNTER — Encounter: Payer: Self-pay | Admitting: Physician Assistant

## 2023-10-27 VITALS — BP 116/71 | HR 78 | Ht 60.0 in | Wt 138.0 lb

## 2023-10-27 DIAGNOSIS — J069 Acute upper respiratory infection, unspecified: Secondary | ICD-10-CM

## 2023-10-27 DIAGNOSIS — R6889 Other general symptoms and signs: Secondary | ICD-10-CM | POA: Diagnosis not present

## 2023-10-27 LAB — POCT INFLUENZA A/B
Influenza A, POC: NEGATIVE
Influenza B, POC: NEGATIVE

## 2023-10-27 LAB — POC COVID19 BINAXNOW: SARS Coronavirus 2 Ag: NEGATIVE

## 2023-10-27 NOTE — Progress Notes (Signed)
 Established patient visit   Patient: Kaitlyn Hanna   DOB: 1940/12/29   83 y.o. Female  MRN: [id number] Visit Date: 10/27/2023  Today's healthcare provider: Alfredia Ferguson, PA-C   Chief Complaint  Patient presents with   Cough    Cough, sore throat, stomach ache and fatigue onset 10/24/2023   Subjective     Pt reports cough, sore throat, nausea, stomach ache, fatigue x 3 days.  She also reports pain on the right side that is very minor, not present currently, comes and goes.   Medications: Outpatient Medications Prior to Visit  Medication Sig   Flaxseed, Linseed, (FLAXSEED OIL PO) Take by mouth daily.   Multiple Vitamins-Minerals (MULTIVITAMIN WITH MINERALS) tablet Take 1 tablet by mouth daily.   Multiple Vitamins-Minerals (VITAMINS/MINERALS PO) as directed Orally   Omega-3 Fatty Acids (FISH OIL PO) Take by mouth daily.   No facility-administered medications prior to visit.    Review of Systems  Constitutional:  Positive for fatigue. Negative for fever.  HENT:  Positive for congestion.   Respiratory:  Positive for cough. Negative for shortness of breath.   Cardiovascular:  Negative for chest pain and leg swelling.  Gastrointestinal:  Negative for abdominal pain.  Neurological:  Negative for dizziness and headaches.       Objective    BP 116/71 (BP Location: Left Arm, Patient Position: Sitting, Cuff Size: Normal)   Pulse 78   Ht 5' (1.524 m)   Wt 138 lb (62.6 kg)   LMP  (LMP Unknown)   BMI 26.95 kg/m    Physical Exam Constitutional:      General: She is awake.     Appearance: She is well-developed.  HENT:     Head: Normocephalic.     Right Ear: Tympanic membrane normal.     Left Ear: Tympanic membrane normal.     Mouth/Throat:     Pharynx: Posterior oropharyngeal erythema present. No oropharyngeal exudate.  Eyes:     Conjunctiva/sclera: Conjunctivae normal.  Cardiovascular:     Rate and Rhythm: Normal rate and regular rhythm.     Heart sounds:  Normal heart sounds.  Pulmonary:     Effort: Pulmonary effort is normal.     Breath sounds: Normal breath sounds.  Skin:    General: Skin is warm.  Neurological:     Mental Status: She is alert and oriented to person, place, and time.  Psychiatric:        Attention and Perception: Attention normal.        Mood and Affect: Mood normal.        Speech: Speech normal.        Behavior: Behavior is cooperative.      Results for orders placed or performed in visit on 10/27/23  POCT Influenza A/B  Result Value Ref Range   Influenza A, POC Negative Negative   Influenza B, POC Negative Negative  POC COVID-19  Result Value Ref Range   SARS Coronavirus 2 Ag Negative Negative    Assessment & Plan    Upper respiratory tract infection, unspecified type  Flu-like symptoms -     POCT Influenza A/B -     POC COVID-19 BinaxNow   Hydrate, rest, recommending otc steroid nasal spray. Otc antihistamines w/ caution given sjogren's disease.  If fatigue persists and if flank/abdominal pain worsens, would recommend f/b in office for urine sample.  Return if symptoms worsen or fail to improve.       Lillia Abed  Florina Ou  [facility] at [facility] [phone] (phone) [phone] (fax)  [facility]

## 2023-11-23 ENCOUNTER — Telehealth: Payer: Self-pay | Admitting: Family Medicine

## 2023-11-23 ENCOUNTER — Other Ambulatory Visit: Payer: Self-pay | Admitting: Family Medicine

## 2023-11-23 DIAGNOSIS — M3501 Sicca syndrome with keratoconjunctivitis: Secondary | ICD-10-CM

## 2023-11-23 NOTE — Telephone Encounter (Signed)
 Copied from CRM [id number]. Topic: Referral - Request for Referral >> Nov 23, 2023  1:54 PM Martinique E wrote: Did the patient discuss referral with their provider in the last year? Yes (If No - schedule appointment) (If Yes - send message)  Appointment offered? No  Type of order/referral and detailed reason for visit: Rheumatology for Sjogren's Syndrome.  Preference of office, provider, location: Dr. Pincus Badder, Rheumatologist at [facility] [address] in [facility].  If referral order, have you been seen by this specialty before? No (If Yes, this issue or another issue? When? Where?  Can we respond through MyChart? Yes

## 2024-02-22 DIAGNOSIS — M3500 Sicca syndrome, unspecified: Secondary | ICD-10-CM | POA: Diagnosis not present

## 2024-03-13 DIAGNOSIS — H53002 Unspecified amblyopia, left eye: Secondary | ICD-10-CM | POA: Diagnosis not present

## 2024-03-13 DIAGNOSIS — H26492 Other secondary cataract, left eye: Secondary | ICD-10-CM | POA: Diagnosis not present

## 2024-03-13 DIAGNOSIS — H16223 Keratoconjunctivitis sicca, not specified as Sjogren's, bilateral: Secondary | ICD-10-CM | POA: Diagnosis not present

## 2024-03-13 DIAGNOSIS — Z961 Presence of intraocular lens: Secondary | ICD-10-CM | POA: Diagnosis not present

## 2024-03-13 DIAGNOSIS — H35373 Puckering of macula, bilateral: Secondary | ICD-10-CM | POA: Diagnosis not present

## 2024-03-13 DIAGNOSIS — H52203 Unspecified astigmatism, bilateral: Secondary | ICD-10-CM | POA: Diagnosis not present

## 2024-03-13 DIAGNOSIS — H43813 Vitreous degeneration, bilateral: Secondary | ICD-10-CM | POA: Diagnosis not present

## 2024-03-13 DIAGNOSIS — H5203 Hypermetropia, bilateral: Secondary | ICD-10-CM | POA: Diagnosis not present

## 2024-03-13 DIAGNOSIS — H524 Presbyopia: Secondary | ICD-10-CM | POA: Diagnosis not present

## 2024-03-21 ENCOUNTER — Inpatient Hospital Stay: Payer: Medicare Other | Admitting: Medical Oncology

## 2024-03-21 ENCOUNTER — Inpatient Hospital Stay: Payer: Medicare Other | Attending: Medical Oncology

## 2024-03-21 ENCOUNTER — Encounter: Payer: Self-pay | Admitting: Medical Oncology

## 2024-03-21 VITALS — BP 102/66 | HR 70 | Temp 98.1°F | Resp 18 | Ht 61.0 in | Wt 135.4 lb

## 2024-03-21 DIAGNOSIS — D472 Monoclonal gammopathy: Secondary | ICD-10-CM | POA: Diagnosis not present

## 2024-03-21 DIAGNOSIS — M3500 Sicca syndrome, unspecified: Secondary | ICD-10-CM | POA: Insufficient documentation

## 2024-03-21 DIAGNOSIS — M3504 Sicca syndrome with tubulo-interstitial nephropathy: Secondary | ICD-10-CM | POA: Diagnosis not present

## 2024-03-21 LAB — CBC WITH DIFFERENTIAL (CANCER CENTER ONLY)
Abs Immature Granulocytes: 0.01 K/uL (ref 0.00–0.07)
Basophils Absolute: 0.1 K/uL (ref 0.0–0.1)
Basophils Relative: 1 %
Eosinophils Absolute: 0.1 K/uL (ref 0.0–0.5)
Eosinophils Relative: 1 %
HCT: 36.7 % (ref 36.0–46.0)
Hemoglobin: 12.1 g/dL (ref 12.0–15.0)
Immature Granulocytes: 0 %
Lymphocytes Relative: 25 %
Lymphs Abs: 1.8 K/uL (ref 0.7–4.0)
MCH: 29.5 pg (ref 26.0–34.0)
MCHC: 33 g/dL (ref 30.0–36.0)
MCV: 89.5 fL (ref 80.0–100.0)
Monocytes Absolute: 0.5 K/uL (ref 0.1–1.0)
Monocytes Relative: 8 %
Neutro Abs: 4.6 K/uL (ref 1.7–7.7)
Neutrophils Relative %: 65 %
Platelet Count: 253 K/uL (ref 150–400)
RBC: 4.1 MIL/uL (ref 3.87–5.11)
RDW: 13.5 % (ref 11.5–15.5)
WBC Count: 7 K/uL (ref 4.0–10.5)
nRBC: 0 % (ref 0.0–0.2)

## 2024-03-21 LAB — CMP (CANCER CENTER ONLY)
ALT: 11 U/L (ref 0–44)
AST: 22 U/L (ref 15–41)
Albumin: 4.3 g/dL (ref 3.5–5.0)
Alkaline Phosphatase: 86 U/L (ref 38–126)
Anion gap: 11 (ref 5–15)
BUN: 15 mg/dL (ref 8–23)
CO2: 26 mmol/L (ref 22–32)
Calcium: 9.2 mg/dL (ref 8.9–10.3)
Chloride: 101 mmol/L (ref 98–111)
Creatinine: 0.99 mg/dL (ref 0.44–1.00)
GFR, Estimated: 57 mL/min — ABNORMAL LOW (ref >60)
Glucose, Bld: 100 mg/dL — ABNORMAL HIGH (ref 70–99)
Potassium: 4.5 mmol/L (ref 3.5–5.1)
Sodium: 137 mmol/L (ref 135–145)
Total Bilirubin: 0.3 mg/dL (ref 0.0–1.2)
Total Protein: 7.6 g/dL (ref 6.5–8.1)

## 2024-03-21 LAB — LACTATE DEHYDROGENASE: LDH: 192 U/L (ref 98–192)

## 2024-03-21 NOTE — Progress Notes (Signed)
 Hematology and Oncology Follow Up Visit  Kaitlyn Hanna [id number] [birth year] 83 y.o. 03/21/2024   Principle Diagnosis:  IgA Kappa MGUS  Current Therapy:   Observation     Interim History:  Kaitlyn Hanna is back for follow-up.  She is seen every 6 months.   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 She is considering surgery at [facility]. Other than this she is well.   When Dr. Maury last saw her back 1 year ago, her monoclonal spike 0.3 g/dL.  The IgA level was 583 mg/dL.    She has had no problem with infections.  She has had no change in bowel or bladder habits.  She has had no cough or shortness of breath. Sjogren's has been stable  There is been no rashes.  She has had no bleeding.  There is been no swollen lymph nodes. No night sweats or unintentional weight loss  Overall, I would say that her performance status is probably ECOG 0.   Wt Readings from Last 3 Encounters:  03/21/24 135 lb 6.4 oz (61.4 kg)  10/27/23 138 lb (62.6 kg)  08/16/23 138 lb (62.6 kg)     Medications:  Current Outpatient Medications:    UNABLE TO FIND, Take 3 tablets by mouth daily. Med Name: Bone Strength, Disp: , Rfl:   Allergies: No Known Allergies  Past Medical History, Surgical history, Social history, and Family History were reviewed and updated.  Review of Systems: Review of Systems  Constitutional: Negative.   HENT:  Negative.    Eyes: Negative.   Respiratory: Negative.    Cardiovascular: Negative.  Negative for palpitations.  Gastrointestinal: Negative.   Endocrine: Negative.   Genitourinary: Negative.    Musculoskeletal: Negative.   Skin: Negative.   Neurological: Negative.   Hematological: Negative.   Psychiatric/Behavioral: Negative.      Physical Exam:  height is 5' 1 (1.549 m) and weight is 135 lb 6.4 oz (61.4 kg). Her oral temperature is 98.1 F (36.7 C). Her blood pressure is 102/66  and her pulse is 70. Her respiration is 18 and oxygen saturation is 100%.   Wt Readings from Last 3 Encounters:  03/21/24 135 lb 6.4 oz (61.4 kg)  10/27/23 138 lb (62.6 kg)  08/16/23 138 lb (62.6 kg)    Physical Exam Vitals reviewed.  HENT:     Head: Normocephalic and atraumatic.  Eyes:     Pupils: Pupils are equal, round, and reactive to light.  Cardiovascular:     Rate and Rhythm: Normal rate and regular rhythm.     Heart sounds: Normal heart sounds.  Pulmonary:     Effort: Pulmonary effort is normal.     Breath sounds: Normal breath sounds.  Abdominal:     General: Bowel sounds are normal.     Palpations: Abdomen is soft.  Musculoskeletal:        General: No tenderness or deformity. Normal range of motion.     Cervical back: Normal range of motion.  Lymphadenopathy:     Cervical: No cervical adenopathy.  Skin:    General: Skin is warm and dry.     Findings: No erythema or rash.  Neurological:     Mental Status: She is alert and oriented to person, place, and time.  Psychiatric:        Behavior: Behavior normal.        Thought Content: Thought content  normal.        Judgment: Judgment normal.      Lab Results  Component Value Date   WBC 7.0 03/21/2024   HGB 12.1 03/21/2024   HCT 36.7 03/21/2024   MCV 89.5 03/21/2024   PLT 253 03/21/2024     Chemistry      Component Value Date/Time   NA 137 03/21/2024 0919   K 4.5 03/21/2024 0919   CL 101 03/21/2024 0919   CO2 26 03/21/2024 0919   BUN 15 03/21/2024 0919   CREATININE 0.99 03/21/2024 0919      Component Value Date/Time   CALCIUM 9.2 03/21/2024 0919   ALKPHOS 86 03/21/2024 0919   AST 22 03/21/2024 0919   ALT 11 03/21/2024 0919   BILITOT 0.3 03/21/2024 0919     Encounter Diagnoses  Name Primary?   MGUS (monoclonal gammopathy of unknown significance) Yes   Sjogren's syndrome with tubulo-interstitial nephropathy (HCC)     Impression and Plan: [name] is a very charming 83 y.o. white female. She  has an IgA kappa MGUS which is thought to be related to her Sjogren's.  MGUS labs pending.  CBC reviewed with patient in office- stable. She wishes to continue her once per year follow up interval   Disposition: RTC 12 months MD, labs (CBC w/, CMP, IgG/IgA, IgM, light chains, LDH, Beta 2 microglobulin, serum electrophoresis)   Kaitlyn CHRISTELLA Dais, PA-C 7/29/202510:00 AM

## 2024-03-22 LAB — KAPPA/LAMBDA LIGHT CHAINS
Kappa free light chain: 27.6 mg/L — ABNORMAL HIGH (ref 3.3–19.4)
Kappa, lambda light chain ratio: 0.97 (ref 0.26–1.65)
Lambda free light chains: 28.5 mg/L — ABNORMAL HIGH (ref 5.7–26.3)

## 2024-03-22 LAB — IGG, IGA, IGM
IgA: 571 mg/dL — ABNORMAL HIGH (ref 64–422)
IgG (Immunoglobin G), Serum: 1109 mg/dL (ref 586–1602)
IgM (Immunoglobulin M), Srm: 92 mg/dL (ref 26–217)

## 2024-03-23 ENCOUNTER — Ambulatory Visit: Payer: Self-pay | Admitting: Medical Oncology

## 2024-03-23 LAB — MULTIPLE MYELOMA PANEL, SERUM
Albumin SerPl Elph-Mcnc: 3.8 g/dL (ref 2.9–4.4)
Albumin/Glob SerPl: 1.3 (ref 0.7–1.7)
Alpha 1: 0.2 g/dL (ref 0.0–0.4)
Alpha2 Glob SerPl Elph-Mcnc: 0.7 g/dL (ref 0.4–1.0)
B-Globulin SerPl Elph-Mcnc: 1.1 g/dL (ref 0.7–1.3)
Gamma Glob SerPl Elph-Mcnc: 1.2 g/dL (ref 0.4–1.8)
Globulin, Total: 3.1 g/dL (ref 2.2–3.9)
IgA: 559 mg/dL — ABNORMAL HIGH (ref 64–422)
IgG (Immunoglobin G), Serum: 1120 mg/dL (ref 586–1602)
IgM (Immunoglobulin M), Srm: 92 mg/dL (ref 26–217)
M Protein SerPl Elph-Mcnc: 0.3 g/dL — ABNORMAL HIGH
Total Protein ELP: 6.9 g/dL (ref 6.0–8.5)

## 2024-03-29 ENCOUNTER — Ambulatory Visit: Payer: Self-pay

## 2024-03-29 NOTE — Telephone Encounter (Signed)
 Appt scheduled

## 2024-03-29 NOTE — Telephone Encounter (Signed)
 FYI Only or Action Required?: FYI only for provider.  Patient was last seen in primary care on 08/16/2023 by Almarie Waddell NOVAK, NP.  Called Nurse Triage reporting Triage.  Symptoms began several weeks ago.  Interventions attempted: OTC medications: ibuprofen PRN.  Symptoms are: gradually worsening.  Triage Disposition: No disposition on file.  Patient/caregiver understands and will follow disposition?:  Answer Assessment - Initial Assessment Questions Denies higher acuity questions. ED precautions reviewed, pt verbalized understanding.  1. ONSET: When did the pain begin?      2 weeks ago 2. LOCATION: Where does it hurt?      Right side of neck 3. PATTERN Does the pain come and go, or has it been constant since it started?      Varies in severity 4. SEVERITY: How bad is the pain?  (Scale 0-10; or none or slight stiffness, mild, moderate, severe)     8/10 at worst 5. RADIATION: Does the pain go anywhere else, shoot into your arms?     Denies 6. CORD SYMPTOMS: Any weakness or numbness of the arms or legs?     Denies 7. CAUSE: What do you think is causing the neck pain?     Unknown 8. NECK OVERUSE: Any recent activities that involved turning or twisting the neck?     Denies  Protocols used: Neck Pain or Stiffness-A-AH Copied from CRM #[id number]. Topic: Appointments - Appointment Scheduling >> Mar 29, 2024  4:18 PM Harlene ORN wrote: pain her right side of neck for two weeks. Says she feels a lump in her pain area. (wants an appointment to be seen tomorrow)

## 2024-03-30 ENCOUNTER — Encounter: Payer: Self-pay | Admitting: Family Medicine

## 2024-03-30 ENCOUNTER — Ambulatory Visit (INDEPENDENT_AMBULATORY_CARE_PROVIDER_SITE_OTHER): Admitting: Family Medicine

## 2024-03-30 VITALS — BP 110/68 | HR 64 | Temp 98.0°F | Resp 18 | Ht 61.0 in | Wt 137.0 lb

## 2024-03-30 DIAGNOSIS — M542 Cervicalgia: Secondary | ICD-10-CM | POA: Diagnosis not present

## 2024-03-30 NOTE — Progress Notes (Signed)
 +  Subjective:    Patient ID: Kaitlyn Hanna, female    DOB: 1941-01-20, 83 y.o.   MRN: [id number]  Chief Complaint  Patient presents with   Neck Pain    X2 week, pt states pain has improved but starts after waking up. Pt states using ibuprofen and didn't help yesterday.  No pain with  movement     HPI Patient is in today for neck pain  Discussed the use of AI scribe software for clinical note transcription with the patient, who gave verbal consent to proceed.  History of Present Illness Kaitlyn Hanna is an 83 year old female with Sjogren's syndrome who presents with neck pain.  She has been experiencing neck pain for the past two weeks, initially severe but significantly improved today. The pain is localized to the back of her neck without radiation to her arms or shoulders. It typically begins about five minutes after she gets up in the morning, but today it started an hour after waking. No recent physical activity or lifting that could have triggered the pain. No recent illness and no pain when lying down.  She manages the pain with ibuprofen, which usually provides relief, although it was ineffective yesterday. She also uses heat therapy, which she finds beneficial.  Her medical history includes Sjogren's syndrome diagnosed in 2021, with previous involvement of a cystic lesion and salivary gland near her right ear. She recalls a previous CT scan done years ago for a soft tissue lump, which was not related to her current symptoms. 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 She also has a history of a spike in protein electrophoresis, which is being monitored annually by an oncologist. Recent blood work in July was normal, and she has not been diagnosed with cancer.   Past Medical History:  Diagnosis Date   Benign schwannoma 06/30/2016   Goals of care, counseling/discussion 03/03/2021   H/O measles    Hearing loss in right  ear 04/14/2016   MGUS (monoclonal gammopathy of unknown significance) 03/03/2021   Osteoporosis 04/14/2016   Raynaud's phenomenon 04/14/2016   Schwannoma of cranial nerve (HCC) 06/30/2016    Past Surgical History:  Procedure Laterality Date   ABDOMINAL HYSTERECTOMY     age 89 TAH b/l SPO for endometriosis   TONSILLECTOMY     WISDOM TOOTH EXTRACTION      Family History  Problem Relation Age of Onset   Heart disease Mother        MI   Cancer Father        lung cancer, small cell, smoker   Hypertension Sister    Dementia Sister    Alcohol abuse Brother    Cancer Maternal Grandmother    Dementia Sister    Arthritis Sister        Rheumtoid    Alzheimer's disease Sister    Cancer Brother     Social History   Socioeconomic History   Marital status: Married    Spouse name: Not on file   Number of children: Not on file   Years of education: Not on file   Highest education level: Not on file  Occupational History   Not on file  Tobacco Use   Smoking status: Never   Smokeless tobacco: Never  Vaping Use   Vaping status: Never Used  Substance and Sexual Activity   Alcohol use: No  Drug use: No   Sexual activity: Yes  Other Topics Concern   Not on file  Social History Narrative   Lives with husband   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 Walks and Y deep water exercise   Raised 2 stepkids, son and daughter   Social Drivers of Health   Financial Resource Strain: Low Risk  (10/24/2020)   Overall Financial Resource Strain (CARDIA)    Difficulty of Paying Living Expenses: Not hard at all  Food Insecurity: No Food Insecurity (10/24/2020)   Hunger Vital Sign    Worried About Running Out of Food in the Last Year: Never true    Ran Out of Food in the Last Year: Never true  Transportation Needs: No Transportation Needs (10/24/2020)   PRAPARE - Administrator, Civil Service (Medical): No    Lack of Transportation  (Non-Medical): No  Physical Activity: Insufficiently Active (10/24/2020)   Exercise Vital Sign    Days of Exercise per Week: 3 days    Minutes of Exercise per Session: 20 min  Stress: No Stress Concern Present (10/24/2020)   [employer] of Occupational Health - Occupational Stress Questionnaire    Feeling of Stress : Not at all  Social Connections: Moderately Integrated (10/24/2020)   Social Connection and Isolation Panel    Frequency of Communication with Friends and Family: More than three times a week    Frequency of Social Gatherings with Friends and Family: More than three times a week    Attends Religious Services: More than 4 times per year    Active Member of [employer] or Organizations: No    Attends Banker Meetings: Never    Marital Status: Married  Catering manager Violence: Not At Risk (10/24/2020)   Humiliation, Afraid, Rape, and Kick questionnaire    Fear of Current or Ex-Partner: No    Emotionally Abused: No    Physically Abused: No    Sexually Abused: No    Outpatient Medications Prior to Visit  Medication Sig Dispense Refill   UNABLE TO FIND Take 3 tablets by mouth daily. Med Name: Bone Strength     No facility-administered medications prior to visit.    No Known Allergies  Review of Systems  Constitutional:  Negative for fever and malaise/fatigue.  HENT:  Negative for congestion.   Eyes:  Negative for blurred vision.  Respiratory:  Negative for cough and shortness of breath.   Cardiovascular:  Negative for chest pain, palpitations and leg swelling.  Gastrointestinal:  Negative for vomiting.  Musculoskeletal:  Negative for back pain.  Skin:  Negative for rash.  Neurological:  Negative for loss of consciousness and headaches.       Objective:    Physical Exam Vitals and nursing note reviewed.  Constitutional:      General: She is not in acute distress.    Appearance: Normal appearance. She is well-developed.  HENT:     Head: Normocephalic  and atraumatic.  Eyes:     General: No scleral icterus.       Right eye: No discharge.        Left eye: No discharge.  Cardiovascular:     Rate and Rhythm: Normal rate and regular rhythm.     Heart sounds: No murmur heard. Pulmonary:     Effort: Pulmonary effort is normal. No respiratory distress.     Breath sounds: Normal breath sounds.  Musculoskeletal:  General: Normal range of motion.     Cervical back: Normal range of motion and neck supple.     Right lower leg: No edema.     Left lower leg: No edema.  Skin:    General: Skin is warm and dry.  Neurological:     Mental Status: She is alert and oriented to person, place, and time.  Psychiatric:        Mood and Affect: Mood normal.        Behavior: Behavior normal.        Thought Content: Thought content normal.        Judgment: Judgment normal.     BP 110/68 (BP Location: Left Arm, Patient Position: Sitting, Cuff Size: Large)   Pulse 64   Temp 98 F (36.7 C) (Oral)   Resp 18   Ht 5' 1 (1.549 m)   Wt 137 lb (62.1 kg)   LMP  (LMP Unknown)   BMI 25.89 kg/m  Wt Readings from Last 3 Encounters:  03/30/24 137 lb (62.1 kg)  03/21/24 135 lb 6.4 oz (61.4 kg)  10/27/23 138 lb (62.6 kg)    Diabetic Foot Exam - Simple   No data filed    Lab Results  Component Value Date   WBC 7.0 03/21/2024   HGB 12.1 03/21/2024   HCT 36.7 03/21/2024   PLT 253 03/21/2024   GLUCOSE 100 (H) 03/21/2024   CHOL 225 (H) 08/11/2022   TRIG 117.0 08/11/2022   HDL 53.70 08/11/2022   LDLCALC 148 (H) 08/11/2022   ALT 11 03/21/2024   AST 22 03/21/2024   NA 137 03/21/2024   K 4.5 03/21/2024   CL 101 03/21/2024   CREATININE 0.99 03/21/2024   BUN 15 03/21/2024   CO2 26 03/21/2024   TSH 2.92 08/11/2022   HGBA1C 6.0 07/14/2023    Lab Results  Component Value Date   TSH 2.92 08/11/2022   Lab Results  Component Value Date   WBC 7.0 03/21/2024   HGB 12.1 03/21/2024   HCT 36.7 03/21/2024   MCV 89.5 03/21/2024   PLT 253  03/21/2024   Lab Results  Component Value Date   NA 137 03/21/2024   K 4.5 03/21/2024   CO2 26 03/21/2024   GLUCOSE 100 (H) 03/21/2024   BUN 15 03/21/2024   CREATININE 0.99 03/21/2024   BILITOT 0.3 03/21/2024   ALKPHOS 86 03/21/2024   AST 22 03/21/2024   ALT 11 03/21/2024   PROT 7.6 03/21/2024   ALBUMIN 4.3 03/21/2024   CALCIUM 9.2 03/21/2024   ANIONGAP 11 03/21/2024   GFR 63.76 07/14/2023   Lab Results  Component Value Date   CHOL 225 (H) 08/11/2022   Lab Results  Component Value Date   HDL 53.70 08/11/2022   Lab Results  Component Value Date   LDLCALC 148 (H) 08/11/2022   Lab Results  Component Value Date   TRIG 117.0 08/11/2022   Lab Results  Component Value Date   CHOLHDL 4 08/11/2022   Lab Results  Component Value Date   HGBA1C 6.0 07/14/2023       Assessment & Plan:  Neck pain on right side -     DG Cervical Spine Complete; Future  Assessment and Plan Assessment & Plan Right-sided neck pain   Intermittent right-sided neck pain has been present for two weeks, showing improvement today. The pain does not radiate to the arms or shoulders and lacks a specific trigger. Ibuprofen provides inconsistent relief. Physical examination reveals tightness  on the right side of the neck, suggesting muscular spasm or strain. Order an x-ray of the neck to evaluate vertebrae. Continue ibuprofen as needed for pain and apply heat to the affected area for relief.  Sjogren's syndrome   Diagnosed in 2021, Sjogren's syndrome previously involved a cystic lesion and salivary gland on the right side. Currently, there is a palpable lump on the right side of the neck, which is less prominent today. No recent illness is reported, and previous imaging and blood work were normal. This may indicate a flare-up of Sjogren's syndrome. Monitor the lump for changes in size or pain and avoid manipulating it to prevent aggravation. Consider a CT scan if the lump does not resolve or if pain  returns.    Kealie Barrie R Lowne Chase, DO

## 2024-04-11 ENCOUNTER — Encounter: Payer: Self-pay | Admitting: Family Medicine

## 2024-04-11 ENCOUNTER — Telehealth (INDEPENDENT_AMBULATORY_CARE_PROVIDER_SITE_OTHER): Admitting: Family Medicine

## 2024-04-11 DIAGNOSIS — Z20822 Contact with and (suspected) exposure to covid-19: Secondary | ICD-10-CM

## 2024-04-11 DIAGNOSIS — J069 Acute upper respiratory infection, unspecified: Secondary | ICD-10-CM

## 2024-04-11 MED ORDER — NIRMATRELVIR/RITONAVIR (PAXLOVID) TABLET (RENAL DOSING): 2.0000 | ORAL_TABLET | Freq: Two times a day (BID) | ORAL | 0 refills | Status: AC

## 2024-04-11 NOTE — Progress Notes (Signed)
 CC: URI complaints  Kaitlyn Hanna here for URI complaints. We are interacting via web portal for an electronic face-to-face visit. I verified patient's ID using 2 identifiers. Patient agreed to proceed with visit via this method. Patient is at home, I am at office. Patient and I are present for visit.   Duration: 3 days  Associated symptoms: Fever (99.2 F), sinus congestion, rhinorrhea, sore throat, myalgia, and coughing, loss of taste/smell, fatigue Denies: sinus pain, itchy watery eyes, ear pain, ear drainage, wheezing, shortness of breath, and N/V/D Treatment to date: Sudafed, Tylenol Sick contacts: Yes- spouse with COVID   Past Medical History:  Diagnosis Date   Benign schwannoma 06/30/2016   Goals of care, counseling/discussion 03/03/2021   H/O measles    Hearing loss in right ear 04/14/2016   MGUS (monoclonal gammopathy of unknown significance) 03/03/2021   Osteoporosis 04/14/2016   Raynaud's phenomenon 04/14/2016   Schwannoma of cranial nerve (HCC) 06/30/2016    Objective No conversational dyspnea Age appropriate judgment and insight Nml affect and mood  Viral URI with cough - Plan: nirmatrelvir /ritonavir , renal dosing, (PAXLOVID ) 10 x 150 MG & 10 x 100MG  TABS  Exposure to COVID-19 virus  Given age and comorbid conditions, will tx w 5 d of Paxlovid . Politely declined cough med. Continue to push fluids, practice good hand hygiene, cover mouth when coughing. Discussed CDC quarantining guidelines.  F/u prn. If starting to experience fevers, shaking, or shortness of breath, seek immediate care. Pt voiced understanding and agreement to the plan.  Mabel [place], DO 04/11/24 11:37 AM

## 2024-05-29 ENCOUNTER — Telehealth: Payer: Self-pay | Admitting: Family Medicine

## 2024-05-29 NOTE — Telephone Encounter (Signed)
 Copied from CRM [id number]. Topic: Medicare AWV >> May 29, 2024 10:09 AM Nathanel DEL wrote: Reason for CRM: Called LVM 05/29/2024 to schedule AWV. Please schedule office or virtual visits.  Nathanel Paschal; Care Guide Ambulatory Clinical Support [facility] Direct Dial: [phone]

## 2024-06-13 DIAGNOSIS — H16223 Keratoconjunctivitis sicca, not specified as Sjogren's, bilateral: Secondary | ICD-10-CM | POA: Diagnosis not present

## 2024-06-14 ENCOUNTER — Ambulatory Visit (INDEPENDENT_AMBULATORY_CARE_PROVIDER_SITE_OTHER): Admitting: *Deleted

## 2024-06-14 ENCOUNTER — Telehealth: Payer: Self-pay | Admitting: *Deleted

## 2024-06-14 VITALS — BP 126/78 | HR 63 | Temp 98.6°F | Resp 18 | Ht 60.5 in | Wt 137.0 lb

## 2024-06-14 DIAGNOSIS — M810 Age-related osteoporosis without current pathological fracture: Secondary | ICD-10-CM

## 2024-06-14 DIAGNOSIS — Z1231 Encounter for screening mammogram for malignant neoplasm of breast: Secondary | ICD-10-CM

## 2024-06-14 DIAGNOSIS — Z0000 Encounter for general adult medical examination without abnormal findings: Secondary | ICD-10-CM

## 2024-06-14 NOTE — Patient Instructions (Addendum)
 Kaitlyn Hanna , Thank you for taking time out of your busy schedule to complete your Annual Wellness Visit with me. I enjoyed our conversation and look forward to speaking with you again next year. I, as well as your care team,  appreciate your ongoing commitment to your health goals. Please review the following plan we discussed and let me know if I can assist you in the future. Your Game plan/ To Do List    Referrals: If you haven't heard from the office you've been referred to, please reach out to them at the phone provided.   Mammogram (due after 10/18/24) [facility]:  [phone] Bone density (due after 09/07/24) [facility]:  [phone]  Follow up Visits: Next Medicare AWV with our clinical staff: 06/15/25  9am, in person.  Next Office Visit with your provider: 08/21/24 10am Dr Domenica.  Clinician Recommendations:  Aim for 30 minutes of exercise or brisk walking, 6-8 glasses of water, and 5 servings of fruits and vegetables each day.   You will need to get the following vaccines at your local pharmacy:  tetanus  This is a list of the screening recommended for you and due dates:  Health Maintenance  Topic Date Due   Medicare Annual Wellness Visit  10/24/2021   DTaP/Tdap/Td vaccine (2 - Td or Tdap) 03/20/2024   Flu Shot  11/21/2024*   COVID-19 Vaccine (4 - 2025-26 season) 06/14/2025*   Zoster (Shingles) Vaccine (1 of 2) 06/14/2025*   Breast Cancer Screening  10/18/2024   Pneumococcal Vaccine for age over 29  Completed   DEXA scan (bone density measurement)  Completed   Meningitis B Vaccine  Aged Out  *Topic was postponed. The date shown is not the original due date.    Advanced directives: (In Chart) A copy of your advanced directives are scanned into your chart should your provider ever need it. Advance Care Planning is important because it:  [x]  Makes sure you receive the medical care that is consistent with your values, goals, and preferences  [x]  It  provides guidance to your family and loved ones and reduces their decisional burden about whether or not they are making the right decisions based on your wishes.  Follow the link provided in your after visit summary or read over the paperwork we have mailed to you to help you started getting your Advance Directives in place. If you need assistance in completing these, please reach out to us  so that we can help you!  See attachments for Preventive Care and Fall Prevention Tips.

## 2024-06-14 NOTE — Telephone Encounter (Signed)
 FYI:   Pt had AWV today and wanted to update you on recent ophthalmology findings. Reports she may be having surgery in the near future.  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  She has follow up with PCP on 08/21/24.

## 2024-06-14 NOTE — Progress Notes (Signed)
 Please attest this visit in the absence of patient primary care provider.    Subjective:   Kaitlyn Hanna is a 83 y.o. who presents for a Medicare Wellness preventive visit.  As a reminder, Annual Wellness Visits don't include a physical exam, and some assessments may be limited, especially if this visit is performed virtually. We may recommend an in-person follow-up visit with your provider if needed.  Visit Complete: In person  Persons Participating in Visit: Patient.  AWV Questionnaire: No: Patient Medicare AWV questionnaire was not completed prior to this visit.  Cardiac Risk Factors include: advanced age (>52men, >53 women);dyslipidemia;Other (see comment), Risk factor comments: Sjogren's     Objective:    Today's Vitals   06/14/24 0848  BP: 126/78  Pulse: 63  Resp: 18  Temp: 98.6 F (37 C)  TempSrc: Oral  SpO2: 100%  Weight: 137 lb (62.1 kg)  Height: 5' 0.5 (1.537 m)   Body mass index is 26.32 kg/m.     06/14/2024    9:12 AM 03/21/2024    9:34 AM 04/13/2023    1:33 AM 03/23/2023    9:37 AM 09/22/2022    9:41 AM 03/23/2022   11:06 AM 09/05/2021   10:57 AM  Advanced Directives  Does Patient Have a Medical Advance Directive? Yes Yes No Yes Yes Yes Yes  Type of Advance Directive Living will Healthcare Power of [place];Living will  Healthcare Power of [place];Living will Living will;Healthcare Power of [employer] Power of [place];Living will Living will;Healthcare Power of Attorney  Does patient want to make changes to medical advance directive? No - Patient declined No - Patient declined  No - Patient declined  No - Patient declined No - Patient declined  Copy of Healthcare Power of Attorney in Chart?  No - copy requested  No - copy requested No - copy requested No - copy requested No - copy requested    Current Medications (verified) Outpatient Encounter Medications as of 06/14/2024  Medication Sig   ascorbic acid (VITAMIN C) 1000 MG tablet Take 1,000  mg by mouth daily.   Cholecalciferol (D3 2000) 50 MCG (2000 UT) CAPS Take 1 capsule by mouth daily.   PREVIDENT 5000 BOOSTER PLUS 1.1 % PSTE Take 1 Application by mouth 2 (two) times daily. On teeth   PREVIDENT 5000 SENSITIVE 1.1-5 % GEL Take 1 Application by mouth in the morning and at bedtime. On teeth   zinc gluconate 50 MG tablet Take 50 mg by mouth daily.   UNABLE TO FIND Take 3 tablets by mouth daily. Med Name: Bone Strength   No facility-administered encounter medications on file as of 06/14/2024.    Allergies (verified) Patient has no known allergies.   History: Past Medical History:  Diagnosis Date   Benign schwannoma 06/30/2016   Goals of care, counseling/discussion 03/03/2021   H/O measles    Hearing loss in right ear 04/14/2016   MGUS (monoclonal gammopathy of unknown significance) 03/03/2021   Osteoporosis 04/14/2016   Raynaud's phenomenon 04/14/2016   Schwannoma of cranial nerve (HCC) 06/30/2016   Past Surgical History:  Procedure Laterality Date   ABDOMINAL HYSTERECTOMY     age 36 TAH b/l SPO for endometriosis   TONSILLECTOMY     WISDOM TOOTH EXTRACTION     Family History  Problem Relation Age of Onset   Heart disease Mother        MI   Cancer Father        lung cancer, small cell, smoker  Hypertension Sister    Dementia Sister    Alcohol abuse Brother    Cancer Maternal Grandmother    Dementia Sister    Arthritis Sister        Rheumtoid    Alzheimer's disease Sister    Cancer Brother    Social History   Socioeconomic History   Marital status: Married    Spouse name: Not on file   Number of children: Not on file   Years of education: Not on file   Highest education level: Not on file  Occupational History   Not on file  Tobacco Use   Smoking status: Never   Smokeless tobacco: Never  Vaping Use   Vaping status: Never Used  Substance and Sexual Activity   Alcohol use: No   Drug use: No   Sexual activity: Yes  Other Topics Concern   Not on  file  Social History Narrative   Lives with husband   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 Walks and Y deep water exercise   Raised 2 stepkids, son and daughter   Social Drivers of Health   Financial Resource Strain: Low Risk  (06/14/2024)   Overall Financial Resource Strain (CARDIA)    Difficulty of Paying Living Expenses: Not hard at all  Recent Concern: Financial Resource Strain - High Risk (06/14/2024)   Overall Financial Resource Strain (CARDIA)    Difficulty of Paying Living Expenses: Hard  Food Insecurity: No Food Insecurity (06/14/2024)   Hunger Vital Sign    Worried About Running Out of Food in the Last Year: Never true    Ran Out of Food in the Last Year: Never true  Transportation Needs: No Transportation Needs (06/14/2024)   PRAPARE - Administrator, Civil Service (Medical): No    Lack of Transportation (Non-Medical): No  Physical Activity: Sufficiently Active (06/14/2024)   Exercise Vital Sign    Days of Exercise per Week: 3 days    Minutes of Exercise per Session: 50 min  Stress: No Stress Concern Present (06/14/2024)   [employer] of Occupational Health - Occupational Stress Questionnaire    Feeling of Stress: Not at all  Social Connections: Socially Integrated (06/14/2024)   Social Connection and Isolation Panel    Frequency of Communication with Friends and Family: More than three times a week    Frequency of Social Gatherings with Friends and Family: Once a week    Attends Religious Services: More than 4 times per year    Active Member of [employer] or Organizations: Yes    Attends Engineer, structural: More than 4 times per year    Marital Status: Married    Tobacco Counseling Counseling given: Not Answered    Clinical Intake:  Pre-visit preparation completed: Yes  Pain : No/denies pain     BMI - recorded: 26.32 Nutritional Status: BMI 25 -29 Overweight Nutritional  Risks: None Diabetes: No  Lab Results  Component Value Date   HGBA1C 6.0 07/14/2023   HGBA1C 6.0 08/11/2022   HGBA1C 5.5 04/15/2021     How often do you need to have someone help you when you read instructions, pamphlets, or other written materials from your doctor or pharmacy?: 1 - Never  Interpreter Needed?: No  Information entered by :: Lolita Libra, CMA(AAMA)   Activities of Daily Living     06/14/2024    9:05 AM  In your present state of health, do  you have any difficulty performing the following activities:  Hearing? 1  Vision? 0  Difficulty concentrating or making decisions? 0  Walking or climbing stairs? 0  Dressing or bathing? 0  Doing errands, shopping? 0  Preparing Food and eating ? N  Using the Toilet? N  In the past six months, have you accidently leaked urine? N  Do you have problems with loss of bowel control? N  Managing your Medications? N  Managing your Finances? N  Housekeeping or managing your Housekeeping? N    Patient Care Team: Domenica Harlene LABOR, MD as PCP - General (Family Medicine) Timmy Maude SAUNDERS, MD as Consulting Physician (Oncology) [place], [facility] (Ophthalmology)  I have updated your Care Teams any recent Medical Services you may have received from other providers in the past year.     Assessment:   This is a routine wellness examination for Jezabelle.  Hearing/Vision screen Hearing Screening - Comments:: Has bilateral hearing aids. Vision Screening - Comments:: Up to date with routine eye exams with Dr Huey / [facility]. May be having surgery on right eye in the future   Goals Addressed             This Visit's Progress    Increase physical activity   On track    Walk 3x per week-2 miles       Depression Screen     06/14/2024    9:09 AM 03/21/2024    9:37 AM 08/16/2023    9:42 AM 08/11/2022    2:26 PM 08/11/2022    2:25 PM 11/19/2021    3:34 PM 10/24/2020    1:20 PM  PHQ 2/9 Scores  PHQ - 2 Score 0 0 0 0  0 0 0  PHQ- 9 Score 0  0 0       Fall Risk     06/14/2024    9:11 AM 08/16/2023    9:42 AM 08/11/2022    2:25 PM 10/24/2020    1:19 PM 09/08/2019    2:10 PM  Fall Risk   Falls in the past year? 0 0 0 0 0   Number falls in past yr: 0 0 0 0 0  Injury with Fall? 0 0 0 0 0  Risk for fall due to : No Fall Risks No Fall Risks     Follow up Education provided Falls evaluation completed Falls evaluation completed  Falls prevention discussed  Education provided;Falls prevention discussed      Data saved with a previous flowsheet row definition    MEDICARE RISK AT HOME:  Medicare Risk at Home Any stairs in or around the home?: No If so, are there any without handrails?: No Home free of loose throw rugs in walkways, pet beds, electrical cords, etc?: Yes Adequate lighting in your home to reduce risk of falls?: Yes Life alert?: No Use of a cane, walker or w/c?: No Grab bars in the bathroom?: Yes Shower chair or bench in shower?: Yes (built in seat) Elevated toilet seat or a handicapped toilet?: Yes  TIMED UP AND GO:  Was the test performed?  Yes  Length of time to ambulate 10 feet: 8 sec Gait steady and fast without use of assistive device  Cognitive Function: 6CIT completed        06/14/2024    9:12 AM  6CIT Screen  What Year? 0 points  What month? 0 points  What time? 0 points  Count back from 20 0 points  Months  in reverse 0 points  Repeat phrase 2 points  Total Score 2 points    Immunizations Immunization History  Administered Date(s) Administered   INFLUENZA, HIGH DOSE SEASONAL PF 05/29/2017   Influenza-Unspecified 06/22/2016, 05/24/2018, 05/24/2020   PFIZER(Purple Top)SARS-COV-2 Vaccination 09/12/2019, 10/02/2019, 07/01/2020   Pneumococcal Conjugate-13 09/10/2014   Pneumococcal Polysaccharide-23 06/14/2008   Tdap 03/20/2014    Screening Tests Health Maintenance  Topic Date Due   Medicare Annual Wellness (AWV)  10/24/2021   DTaP/Tdap/Td (2 - Td or Tdap)  03/20/2024   Influenza Vaccine  11/21/2024 (Originally 03/24/2024)   COVID-19 Vaccine (4 - 2025-26 season) 06/14/2025 (Originally 04/24/2024)   Zoster Vaccines- Shingrix (1 of 2) 06/14/2025 (Originally 01/09/1960)   Mammogram  10/18/2024   Pneumococcal Vaccine: 50+ Years  Completed   DEXA SCAN  Completed   Meningococcal B Vaccine  Aged Out    Health Maintenance Items Addressed: Pt declines flu, covid and shingles vaccine. Will consider tetanus at pharmacy. Mammogram and Dexa ordered for upcoming year.  Additional Screening:  Vision Screening: Recommended annual ophthalmology exams for early detection of glaucoma and other disorders of the eye. Is the patient up to date with their annual eye exam?  Yes  Who is the provider or what is the name of the office in which the patient attends annual eye exams? [place] / [facility]  Dental Screening: Recommended annual dental exams for proper oral hygiene  Community Resource Referral / Chronic Care Management: CRR required this visit?  No   CCM required this visit?  No   Plan:    I have personally reviewed and noted the following in the patient's chart:   Medical and social history Use of alcohol, tobacco or illicit drugs  Current medications and supplements including opioid prescriptions. Patient is not currently taking opioid prescriptions. Functional ability and status Nutritional status Physical activity Advanced directives List of other physicians Hospitalizations, surgeries, and ER visits in previous 12 months Vitals Screenings to include cognitive, depression, and falls Referrals and appointments  In addition, I have reviewed and discussed with patient certain preventive protocols, quality metrics, and best practice recommendations. A written personalized care plan for preventive services as well as general preventive health recommendations were provided to patient.   Lolita Libra, CMA   06/14/2024   After Visit Summary: (In  Person-Printed) AVS printed and given to the patient  Notes: see phone note

## 2024-08-20 NOTE — Assessment & Plan Note (Signed)
 hgba1c acceptable, minimize simple carbs. Increase exercise as tolerated.

## 2024-08-20 NOTE — Assessment & Plan Note (Signed)
 Patient encouraged to maintain heart healthy diet, regular exercise, adequate sleep. Consider daily probiotics. Take medications as prescribed. Labs ordered and reviewed. Has aged out of screening colonoscopy and paps  DEXA: Last completed on 08/2022. This patient is considered osteoporotic according to World Health Organization [facility]) criteria. Repeat in 2-5 years. Order placed.   Mammogram: Last completed on 09/2023. No mammographic evidence of malignancy. Repeat in 1-2 years.    Dermatology: Encouraged patient to have body checked by dermatologist.   Healthy Lifestyle: Encouraged exercise, heart healthy diet, and hydration.   Immunizations: Encouraged COVID-19, Influenza, RSV, Shingles, and Tetanus (if injured) immunizations.

## 2024-08-20 NOTE — Assessment & Plan Note (Signed)
 Encouraged to get adequate exercise, calcium and vitamin d intake

## 2024-08-20 NOTE — Assessment & Plan Note (Signed)
 Tolerating statin, encouraged heart healthy diet, avoid trans fats, minimize simple carbs and saturated fats. Increase exercise as tolerated

## 2024-08-21 ENCOUNTER — Encounter: Payer: Self-pay | Admitting: Family Medicine

## 2024-08-21 ENCOUNTER — Ambulatory Visit (INDEPENDENT_AMBULATORY_CARE_PROVIDER_SITE_OTHER): Payer: Medicare Other | Admitting: Family Medicine

## 2024-08-21 VITALS — BP 120/70 | HR 65 | Temp 97.8°F | Resp 16 | Ht 60.5 in | Wt 137.0 lb

## 2024-08-21 DIAGNOSIS — R739 Hyperglycemia, unspecified: Secondary | ICD-10-CM | POA: Diagnosis not present

## 2024-08-21 DIAGNOSIS — E782 Mixed hyperlipidemia: Secondary | ICD-10-CM | POA: Diagnosis not present

## 2024-08-21 DIAGNOSIS — H9193 Unspecified hearing loss, bilateral: Secondary | ICD-10-CM | POA: Diagnosis not present

## 2024-08-21 DIAGNOSIS — Z0000 Encounter for general adult medical examination without abnormal findings: Secondary | ICD-10-CM

## 2024-08-21 DIAGNOSIS — M810 Age-related osteoporosis without current pathological fracture: Secondary | ICD-10-CM

## 2024-08-21 LAB — CBC WITH DIFFERENTIAL/PLATELET
Basophils Absolute: 0 K/uL (ref 0.0–0.1)
Basophils Relative: 0.6 % (ref 0.0–3.0)
Eosinophils Absolute: 0.1 K/uL (ref 0.0–0.7)
Eosinophils Relative: 1 % (ref 0.0–5.0)
HCT: 36.6 % (ref 36.0–46.0)
Hemoglobin: 12.1 g/dL (ref 12.0–15.0)
Lymphocytes Relative: 27.9 % (ref 12.0–46.0)
Lymphs Abs: 1.7 K/uL (ref 0.7–4.0)
MCHC: 33.1 g/dL (ref 30.0–36.0)
MCV: 88.6 fl (ref 78.0–100.0)
Monocytes Absolute: 0.6 K/uL (ref 0.1–1.0)
Monocytes Relative: 8.9 % (ref 3.0–12.0)
Neutro Abs: 3.9 K/uL (ref 1.4–7.7)
Neutrophils Relative %: 61.6 % (ref 43.0–77.0)
Platelets: 245 K/uL (ref 150.0–400.0)
RBC: 4.13 Mil/uL (ref 3.87–5.11)
RDW: 13.9 % (ref 11.5–15.5)
WBC: 6.3 K/uL (ref 4.0–10.5)

## 2024-08-21 LAB — LIPID PANEL
Cholesterol: 222 mg/dL — ABNORMAL HIGH (ref 28–200)
HDL: 57.4 mg/dL
LDL Cholesterol: 150 mg/dL — ABNORMAL HIGH (ref 10–99)
NonHDL: 164.59
Total CHOL/HDL Ratio: 4
Triglycerides: 71 mg/dL (ref 10.0–149.0)
VLDL: 14.2 mg/dL (ref 0.0–40.0)

## 2024-08-21 LAB — COMPREHENSIVE METABOLIC PANEL WITH GFR
ALT: 11 U/L (ref 3–35)
AST: 17 U/L (ref 5–37)
Albumin: 4.3 g/dL (ref 3.5–5.2)
Alkaline Phosphatase: 84 U/L (ref 39–117)
BUN: 16 mg/dL (ref 6–23)
CO2: 28 meq/L (ref 19–32)
Calcium: 9.1 mg/dL (ref 8.4–10.5)
Chloride: 103 meq/L (ref 96–112)
Creatinine, Ser: 0.92 mg/dL (ref 0.40–1.20)
GFR: 58.35 mL/min — ABNORMAL LOW
Glucose, Bld: 94 mg/dL (ref 70–99)
Potassium: 4.3 meq/L (ref 3.5–5.1)
Sodium: 140 meq/L (ref 135–145)
Total Bilirubin: 0.4 mg/dL (ref 0.2–1.2)
Total Protein: 7.2 g/dL (ref 6.0–8.3)

## 2024-08-21 LAB — TSH: TSH: 2.79 u[IU]/mL (ref 0.35–5.50)

## 2024-08-21 LAB — HEMOGLOBIN A1C: Hgb A1c MFr Bld: 6 % (ref 4.6–6.5)

## 2024-08-21 NOTE — Progress Notes (Signed)
 "  Subjective:    Patient ID: Kaitlyn Hanna, female    DOB: 28-Apr-1941, 83 y.o.   MRN: [id number]  Chief Complaint  Patient presents with   Annual Exam    Patient presents today for a physical exam.   Quality Metric Gaps    TDAP vaccine    HPI Discussed the use of AI scribe software for clinical note transcription with the patient, who gave verbal consent to proceed.  History of Present Illness Kaitlyn Hanna is an 83 year old female with multiple eye conditions who presents for a follow-up regarding her eye health.  She has experienced a decline in vision since turning 37 and is under the care of an eye specialist with an upcoming appointment on January 9th. She has been diagnosed with epiretinal membrane in both eyes, corneal epithelial basement membrane dystrophy, hyperopia, posterior vitreous detachment, and posterior capsular opacifications, which are not visually significant. Surgery was postponed due to severe dry eyes.  She manages her dry eyes with prescription eye drops twice daily, a salve at night, Systane for relief, and warm compresses twice a day.  She has a history of dental issues secondary to her Sjogren's, having had twelve teeth treated between October and November. She is scheduled to visit a dental specialist at [facility] on January 7th for further evaluation and treatment planning, potentially involving bone grafts.  She experiences significant hearing loss, with only about ten percent hearing in her right ear, and uses hearing aids. She is seeking a referral to an ENT specialist for further evaluation of her hearing issues.  No recent ER visits, breathing troubles, or abdominal issues. Reports worsening vision and dry eyes.    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   Epiretinal membrane (ERM) of both eyes 2025   Goals of care, counseling/discussion 03/03/2021    H/O measles    Hearing loss in right ear 04/14/2016   Hyperopia with astigmatism and presbyopia, bilateral 2025   MGUS (monoclonal gammopathy of unknown significance) 03/03/2021   Osteoporosis 04/14/2016   Posterior capsular opacification non visually significant, left 2025   Posterior vitreous detachment of both eyes 2025   Raynaud's phenomenon 04/14/2016   Schwannoma of cranial nerve (HCC) 06/30/2016    Past Surgical History:  Procedure Laterality Date   ABDOMINAL HYSTERECTOMY     age 60 TAH b/l SPO for endometriosis   TONSILLECTOMY     WISDOM TOOTH EXTRACTION      Family History  Problem Relation Age of Onset   Heart disease Mother        MI   Cancer Father        lung cancer, small cell, smoker   Hypertension Sister    Dementia Sister    Alcohol abuse Brother    Cancer Maternal Grandmother    Dementia Sister    Arthritis Sister        Rheumtoid    Alzheimer's disease Sister    Cancer Brother     Social History   Socioeconomic History   Marital status: Married    Spouse name: Not on file   Number of children: Not on file   Years of education: Not on file   Highest education level: Not on file  Occupational History   Not on file  Tobacco Use   Smoking status: Never   Smokeless tobacco: Never  Vaping Use   Vaping  status: Never Used  Substance and Sexual Activity   Alcohol use: No   Drug use: No   Sexual activity: Yes  Other Topics Concern   Not on file  Social History Narrative   Lives with husband   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 Walks and Y deep water exercise   Raised 2 stepkids, son and daughter   Social Drivers of Health   Tobacco Use: Low Risk (08/21/2024)   Patient History    Smoking Tobacco Use: Never    Smokeless Tobacco Use: Never    Passive Exposure: Not on file  Financial Resource Strain: Low Risk (06/14/2024)   Overall Financial Resource Strain (CARDIA)    Difficulty  of Paying Living Expenses: Not hard at all  Recent Concern: Financial Resource Strain - High Risk (06/14/2024)   Overall Financial Resource Strain (CARDIA)    Difficulty of Paying Living Expenses: Hard  Food Insecurity: No Food Insecurity (06/14/2024)   Epic    Worried About Radiation Protection Practitioner of Food in the Last Year: Never true    Ran Out of Food in the Last Year: Never true  Transportation Needs: No Transportation Needs (06/14/2024)   Epic    Lack of Transportation (Medical): No    Lack of Transportation (Non-Medical): No  Physical Activity: Sufficiently Active (06/14/2024)   Exercise Vital Sign    Days of Exercise per Week: 3 days    Minutes of Exercise per Session: 50 min  Stress: No Stress Concern Present (06/14/2024)   [employer] of Occupational Health - Occupational Stress Questionnaire    Feeling of Stress: Not at all  Social Connections: Socially Integrated (06/14/2024)   Social Connection and Isolation Panel    Frequency of Communication with Friends and Family: More than three times a week    Frequency of Social Gatherings with Friends and Family: Once a week    Attends Religious Services: More than 4 times per year    Active Member of [employer] or Organizations: Yes    Attends Banker Meetings: More than 4 times per year    Marital Status: Married  Catering Manager Violence: Not At Risk (06/14/2024)   Epic    Fear of Current or Ex-Partner: No    Emotionally Abused: No    Physically Abused: No    Sexually Abused: No  Depression (PHQ2-9): Low Risk (06/14/2024)   Depression (PHQ2-9)    PHQ-2 Score: 0  Alcohol Screen: Low Risk (06/14/2024)   Alcohol Screen    Last Alcohol Screening Score (AUDIT): 0  Housing: Low Risk (06/14/2024)   Epic    Unable to Pay for Housing in the Last Year: No    Number of Times Moved in the Last Year: 0    Homeless in the Last Year: No  Utilities: Not At Risk (06/14/2024)   Epic    Threatened with loss of utilities: No   Health Literacy: Adequate Health Literacy (06/14/2024)   B1300 Health Literacy    Frequency of need for help with medical instructions: Never    Outpatient Medications Prior to Visit  Medication Sig Dispense Refill   ascorbic acid (VITAMIN C) 1000 MG tablet Take 1,000 mg by mouth daily.     Cholecalciferol (D3 2000) 50 MCG (2000 UT) CAPS Take 1 capsule by mouth daily.     cycloSPORINE (RESTASIS) 0.05 % ophthalmic emulsion Apply 1 drop to eye.     PREVIDENT 5000 BOOSTER PLUS  1.1 % PSTE Take 1 Application by mouth 2 (two) times daily. On teeth     PREVIDENT 5000 SENSITIVE 1.1-5 % GEL Take 1 Application by mouth in the morning and at bedtime. On teeth     UNABLE TO FIND Take 3 tablets by mouth daily. Med Name: Bone Strength     zinc gluconate 50 MG tablet Take 50 mg by mouth daily.     No facility-administered medications prior to visit.    Allergies[1]  Review of Systems  Constitutional:  Negative for chills, fever and malaise/fatigue.  HENT:  Positive for hearing loss. Negative for congestion.   Eyes:  Negative for discharge.  Respiratory:  Negative for cough, sputum production and shortness of breath.   Cardiovascular:  Negative for chest pain, palpitations and leg swelling.  Gastrointestinal:  Negative for abdominal pain, blood in stool, constipation, diarrhea, heartburn, nausea and vomiting.  Genitourinary:  Negative for dysuria, frequency, hematuria and urgency.  Musculoskeletal:  Negative for back pain, falls and myalgias.  Skin:  Negative for rash.  Neurological:  Negative for dizziness, sensory change, loss of consciousness, weakness and headaches.  Endo/Heme/Allergies:  Negative for environmental allergies. Does not bruise/bleed easily.  Psychiatric/Behavioral:  Negative for depression and suicidal ideas. The patient is not nervous/anxious and does not have insomnia.        Objective:    Physical Exam Constitutional:      General: She is not in acute distress.     Appearance: Normal appearance. She is not diaphoretic.  HENT:     Head: Normocephalic and atraumatic.     Right Ear: Tympanic membrane, ear canal and external ear normal.     Left Ear: Tympanic membrane, ear canal and external ear normal.     Nose: Nose normal.     Mouth/Throat:     Mouth: Mucous membranes are moist.     Pharynx: Oropharynx is clear. No oropharyngeal exudate.  Eyes:     General: No scleral icterus.       Right eye: No discharge.        Left eye: No discharge.     Conjunctiva/sclera: Conjunctivae normal.     Pupils: Pupils are equal, round, and reactive to light.  Neck:     Thyroid : No thyromegaly.  Cardiovascular:     Rate and Rhythm: Normal rate and regular rhythm.     Heart sounds: Normal heart sounds. No murmur heard. Pulmonary:     Effort: Pulmonary effort is normal. No respiratory distress.     Breath sounds: Normal breath sounds. No wheezing or rales.  Abdominal:     General: Bowel sounds are normal. There is no distension.     Palpations: Abdomen is soft. There is no mass.     Tenderness: There is no abdominal tenderness.  Musculoskeletal:        General: No tenderness. Normal range of motion.     Cervical back: Normal range of motion and neck supple.  Lymphadenopathy:     Cervical: No cervical adenopathy.  Skin:    General: Skin is warm and dry.     Findings: No rash.  Neurological:     General: No focal deficit present.     Mental Status: She is alert and oriented to person, place, and time.     Cranial Nerves: No cranial nerve deficit.     Coordination: Coordination normal.     Deep Tendon Reflexes: Reflexes are normal and symmetric. Reflexes normal.  Psychiatric:  Mood and Affect: Mood normal.        Behavior: Behavior normal.        Thought Content: Thought content normal.        Judgment: Judgment normal.     BP 120/70   Pulse 65   Temp 97.8 F (36.6 C)   Resp 16   Ht 5' 0.5 (1.537 m)   Wt 137 lb (62.1 kg)   LMP  (LMP  Unknown)   SpO2 98%   BMI 26.32 kg/m  Wt Readings from Last 3 Encounters:  08/21/24 137 lb (62.1 kg)  06/14/24 137 lb (62.1 kg)  03/30/24 137 lb (62.1 kg)    Diabetic Foot Exam - Simple   No data filed    Lab Results  Component Value Date   WBC 7.0 03/21/2024   HGB 12.1 03/21/2024   HCT 36.7 03/21/2024   PLT 253 03/21/2024   GLUCOSE 100 (H) 03/21/2024   CHOL 225 (H) 08/11/2022   TRIG 117.0 08/11/2022   HDL 53.70 08/11/2022   LDLCALC 148 (H) 08/11/2022   ALT 11 03/21/2024   AST 22 03/21/2024   NA 137 03/21/2024   K 4.5 03/21/2024   CL 101 03/21/2024   CREATININE 0.99 03/21/2024   BUN 15 03/21/2024   CO2 26 03/21/2024   TSH 2.92 08/11/2022   HGBA1C 6.0 07/14/2023    Lab Results  Component Value Date   TSH 2.92 08/11/2022   Lab Results  Component Value Date   WBC 7.0 03/21/2024   HGB 12.1 03/21/2024   HCT 36.7 03/21/2024   MCV 89.5 03/21/2024   PLT 253 03/21/2024   Lab Results  Component Value Date   NA 137 03/21/2024   K 4.5 03/21/2024   CO2 26 03/21/2024   GLUCOSE 100 (H) 03/21/2024   BUN 15 03/21/2024   CREATININE 0.99 03/21/2024   BILITOT 0.3 03/21/2024   ALKPHOS 86 03/21/2024   AST 22 03/21/2024   ALT 11 03/21/2024   PROT 7.6 03/21/2024   ALBUMIN 4.3 03/21/2024   CALCIUM 9.2 03/21/2024   ANIONGAP 11 03/21/2024   GFR 63.76 07/14/2023   Lab Results  Component Value Date   CHOL 225 (H) 08/11/2022   Lab Results  Component Value Date   HDL 53.70 08/11/2022   Lab Results  Component Value Date   LDLCALC 148 (H) 08/11/2022   Lab Results  Component Value Date   TRIG 117.0 08/11/2022   Lab Results  Component Value Date   CHOLHDL 4 08/11/2022   Lab Results  Component Value Date   HGBA1C 6.0 07/14/2023       Assessment & Plan:  Preventative health care Assessment & Plan: Patient encouraged to maintain heart healthy diet, regular exercise, adequate sleep. Consider daily probiotics. Take medications as prescribed. Labs ordered and  reviewed. Has aged out of screening colonoscopy and paps  DEXA: Last completed on 08/2022. This patient is considered osteoporotic according to World Health Organization [facility]) criteria. Repeat every 2-5 years.    Mammogram: Last completed on 09/2023. No mammographic evidence of malignancy. Repeat in 1-2 years.    Dermatology: Encouraged patient to have body checked by dermatologist.   Healthy Lifestyle: Encouraged exercise, heart healthy diet, and hydration.   Immunizations: Encouraged COVID-19, Influenza, RSV, Shingles, and Tetanus (if injured) immunizations.      Orders: -     Comprehensive metabolic panel with GFR -     CBC with Differential/Platelet -     Lipid panel -  Hemoglobin A1c -     TSH  Osteoporosis, unspecified osteoporosis type, unspecified pathological fracture presence Assessment & Plan: Encouraged to get adequate exercise, calcium and vitamin d  intake   Hyperlipidemia, mixed Assessment & Plan: Tolerating statin, encouraged heart healthy diet, avoid trans fats, minimize simple carbs and saturated fats. Increase exercise as tolerated  Orders: -     Lipid panel -     TSH  Hyperglycemia Assessment & Plan: hgba1c acceptable, minimize simple carbs. Increase exercise as tolerated.   Orders: -     Hemoglobin A1c  Bilateral hearing loss, unspecified hearing loss type -     Ambulatory referral to ENT    Assessment and Plan Assessment & Plan Adult Wellness Visit Routine wellness visit with no acute concerns. Discussed general health maintenance, including vaccinations and screenings. Emphasized hydration to prevent dementia and discussed risks of infections in older age. - Ordered blood work for cholesterol, glucose, and renal function - Scheduled bone density scan for March 4th, 2026 - Discussed tetanus vaccination at pharmacy - Discussed Prevnar 20 booster for pneumococcal pneumonia - Discussed RSV, flu, and COVID vaccinations - Discussed shingles  vaccination and its link to reduced dementia risk - Encouraged annual dermatology visits - Scheduled follow-up in 6 months and 1 year  Osteoporosis Bone density scan ordered for March 4th, 2026.  Hyperlipidemia Routine monitoring of cholesterol levels. - Ordered blood work for cholesterol levels  Hyperglycemia - Ordered blood work for glucose levels  Bilateral hearing loss Decreased hearing, right ear worse than left. Referral to ENT for further evaluation. - Referred to ENT in [facility] for hearing evaluation  Dry eye syndrome and corneal epithelial basement membrane dystrophy Dry eyes and corneal epithelial basement membrane dystrophy. Currently using eye drops and salve. Surgery deferred due to dryness. Discussed blinking technique to improve tear production. - Continue eye drops and salve - Perform warm compresses twice daily - Blink strongly ten times after using Systane drops  Dental disease with tooth loss Significant tooth loss and recent dental procedures. Scheduled for further evaluation at dental school for potential bone grafts and treatment planning. - Attend dental evaluation at dental school on January 7th, 2026  Recording duration: 36 minutes     Harlene Horton, MD    [1] No Known Allergies  "

## 2024-08-21 NOTE — Patient Instructions (Addendum)
 All [facility] pharmacies are all walk in vaccine  clinics M-F 9-4  Tetanus is due  Prevnar 20 is due  RSV, Respiratory Syncitial Virus Vaccine, Arexvy   Flu and Covid vaccine annually  Shingrix is the new shingles shot, 2 shots over 2-6 months, confirm coverage with insurance and document, then can return here for shots with nurse appt or at pharmacy   Annual dermatology visit  Preventive Care 65 Years and Older, Female Preventive care refers to lifestyle choices and visits with your health care provider that can promote health and wellness. Preventive care visits are also called wellness exams. What can I expect for my preventive care visit? Counseling Your health care provider may ask you questions about your: Medical history, including: Past medical problems. Family medical history. Pregnancy and menstrual history. History of falls. Current health, including: Memory and ability to understand (cognition). Emotional well-being. Home life and relationship well-being. Sexual activity and sexual health. Lifestyle, including: Alcohol, nicotine or tobacco, and drug use. Access to firearms. Diet, exercise, and sleep habits. Work and work astronomer. Sunscreen use. Safety issues such as seatbelt and bike helmet use. Physical exam Your health care provider will check your: Height and weight. These may be used to calculate your BMI (body mass index). BMI is a measurement that tells if you are at a healthy weight. Waist circumference. This measures the distance around your waistline. This measurement also tells if you are at a healthy weight and may help predict your risk of certain diseases, such as type 2 diabetes and high blood pressure. Heart rate and blood pressure. Body temperature. Skin for abnormal spots. What immunizations do I need?  Vaccines are usually given at various ages, according to a schedule. Your health care provider will recommend vaccines for you based on your  age, medical history, and lifestyle or other factors, such as travel or where you work. What tests do I need? Screening Your health care provider may recommend screening tests for certain conditions. This may include: Lipid and cholesterol levels. Hepatitis C test. Hepatitis B test. HIV (human immunodeficiency virus) test. STI (sexually transmitted infection) testing, if you are at risk. Lung cancer screening. Colorectal cancer screening. Diabetes screening. This is done by checking your blood sugar (glucose) after you have not eaten for a while (fasting). Mammogram. Talk with your health care provider about how often you should have regular mammograms. BRCA-related cancer screening. This may be done if you have a family history of breast, ovarian, tubal, or peritoneal cancers. Bone density scan. This is done to screen for osteoporosis. Talk with your health care provider about your test results, treatment options, and if necessary, the need for more tests. Follow these instructions at home: Eating and drinking  Eat a diet that includes fresh fruits and vegetables, whole grains, lean protein, and low-fat dairy products. Limit your intake of foods with high amounts of sugar, saturated fats, and salt. Take vitamin and mineral supplements as recommended by your health care provider. Do not drink alcohol if your health care provider tells you not to drink. If you drink alcohol: Limit how much you have to 0-1 drink a day. Know how much alcohol is in your drink. In the [place] one drink equals one 12 oz bottle of beer (355 mL), one 5 oz glass of wine (148 mL), or one 1 oz glass of hard liquor (44 mL). Lifestyle Brush your teeth every morning and night with fluoride toothpaste. Floss one time each day. Exercise for at least  30 minutes 5 or more days each week. Do not use any products that contain nicotine or tobacco. These products include cigarettes, chewing tobacco, and vaping devices, such as  e-cigarettes. If you need help quitting, ask your health care provider. Do not use drugs. If you are sexually active, practice safe sex. Use a condom or other form of protection in order to prevent STIs. Take aspirin only as told by your health care provider. Make sure that you understand how much to take and what form to take. Work with your health care provider to find out whether it is safe and beneficial for you to take aspirin daily. Ask your health care provider if you need to take a cholesterol-lowering medicine (statin). Find healthy ways to manage stress, such as: Meditation, yoga, or listening to music. Journaling. Talking to a trusted person. Spending time with friends and family. Minimize exposure to UV radiation to reduce your risk of skin cancer. Safety Always wear your seat belt while driving or riding in a vehicle. Do not drive: If you have been drinking alcohol. Do not ride with someone who has been drinking. When you are tired or distracted. While texting. If you have been using any mind-altering substances or drugs. Wear a helmet and other protective equipment during sports activities. If you have firearms in your house, make sure you follow all gun safety procedures. What's next? Visit your health care provider once a year for an annual wellness visit. Ask your health care provider how often you should have your eyes and teeth checked. Stay up to date on all vaccines. This information is not intended to replace advice given to you by your health care provider. Make sure you discuss any questions you have with your health care provider. Document Revised: 02/05/2021 Document Reviewed: 02/05/2021 Elsevier Patient Education  2024 [employer].

## 2024-08-22 ENCOUNTER — Ambulatory Visit: Payer: Self-pay | Admitting: Family Medicine

## 2024-09-25 ENCOUNTER — Telehealth: Payer: Self-pay | Admitting: Family Medicine

## 2024-09-25 NOTE — Telephone Encounter (Signed)
 Routing this back due to the question about vaccines not regarding new pcp.    Copied from CRM #[id number]. Topic: General - Other >> Sep 25, 2024  1:03 PM Alfonso HERO wrote: Reason for CRM: patient calling because she is expecting a grandchild and wanting to know if her getting the Flu and TDAP vaccination is ok due to her medical condition. Also, she would like to be recommended to someone else in the office to see her once Dr Domenica leaves so she can schedule an appt for a physical.

## 2024-09-27 ENCOUNTER — Encounter (INDEPENDENT_AMBULATORY_CARE_PROVIDER_SITE_OTHER): Payer: Self-pay | Admitting: Otolaryngology

## 2024-09-27 ENCOUNTER — Ambulatory Visit (INDEPENDENT_AMBULATORY_CARE_PROVIDER_SITE_OTHER): Admitting: Otolaryngology

## 2024-09-27 ENCOUNTER — Other Ambulatory Visit (HOSPITAL_BASED_OUTPATIENT_CLINIC_OR_DEPARTMENT_OTHER): Payer: Self-pay

## 2024-09-27 VITALS — BP 122/73 | HR 80 | Ht 61.0 in | Wt 137.0 lb

## 2024-09-27 DIAGNOSIS — D333 Benign neoplasm of cranial nerves: Secondary | ICD-10-CM | POA: Insufficient documentation

## 2024-09-27 DIAGNOSIS — H9041 Sensorineural hearing loss, unilateral, right ear, with unrestricted hearing on the contralateral side: Secondary | ICD-10-CM | POA: Insufficient documentation

## 2024-09-27 DIAGNOSIS — R42 Dizziness and giddiness: Secondary | ICD-10-CM | POA: Insufficient documentation

## 2024-09-27 MED ORDER — FLUZONE HIGH-DOSE 0.5 ML IM SUSY
0.5000 mL | PREFILLED_SYRINGE | Freq: Once | INTRAMUSCULAR | 0 refills | Status: AC
  Filled 2024-09-27: qty 0.5, 1d supply, fill #0

## 2024-09-27 MED ORDER — ADACEL 5-2-15.5 LF-MCG/0.5 IM SUSY
0.5000 mL | PREFILLED_SYRINGE | Freq: Once | INTRAMUSCULAR | 0 refills | Status: AC
  Filled 2024-09-27: qty 0.5, 1d supply, fill #0

## 2024-09-27 NOTE — Progress Notes (Unsigned)
 CC: Right IAC acoustic neuroma, right ear hearing loss, dizziness  Discussed the use of AI scribe software for clinical note transcription with the patient, who gave verbal consent to proceed.  History of Present Illness Kaitlyn Hanna is an 84 year old female with right-sided acoustic neuroma who presents for evaluation of progressive right-sided hearing loss and dizziness.  She was diagnosed with right acoustic neuroma over ten years ago after developing asymmetric right ear hearing loss. Over the past several years, she has experienced progressive worsening of right-sided hearing, with significant decline since her previous visit in 2022. She currently describes her right ear hearing as horrible and [facility] has estimated approximately 10% residual hearing in that ear. She uses a CROS hearing aid system, which transmits sound from the right to the left ear, but notes that it sometimes causes muffling of sound when people are on her left side.  She has not pursued radiation or surgical intervention. Her most recent MRI in July 2024 demonstrated a stable 7 mm right acoustic neuroma with no significant change from prior imaging.  She describes chronic dizziness, stating she is very off balance but denies recent spinning vertigo. She has not participated in physical therapy for balance. She is cautious when ambulating, especially on uneven ground, and receives assistance from family members when exiting vehicles. She does not use a cane or walker.  She has severe vision loss in the left eye due to amblyopia and relies on her right eye for vision. She acknowledges that having only one functional eye and one functional ear has further impacted her balance and depth perception. She is under the care of an ophthalmologist for her visual impairment.     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    Epiretinal membrane (ERM) of both eyes 2025   Goals of care, counseling/discussion 03/03/2021   H/O measles    Hearing loss in right ear 04/14/2016   Hyperopia with astigmatism and presbyopia, bilateral 2025   MGUS (monoclonal gammopathy of unknown significance) 03/03/2021   Osteoporosis 04/14/2016   Posterior capsular opacification non visually significant, left 2025   Posterior vitreous detachment of both eyes 2025   Raynaud's phenomenon 04/14/2016   Schwannoma of cranial nerve (HCC) 06/30/2016    Past Surgical History:  Procedure Laterality Date   ABDOMINAL HYSTERECTOMY     age 60 TAH b/l SPO for endometriosis   TONSILLECTOMY     WISDOM TOOTH EXTRACTION      Family History  Problem Relation Age of Onset   Heart disease Mother        MI   Cancer Father        lung cancer, small cell, smoker   Hypertension Sister    Dementia Sister    Alcohol abuse Brother    Cancer Maternal Grandmother    Dementia Sister    Arthritis Sister        Rheumtoid    Alzheimer's disease Sister    Cancer Brother     Social History:  reports that she has never smoked. She has never used smokeless tobacco. She reports that she does not drink alcohol and does not use drugs.  Allergies: Allergies[1]  Prior to Admission medications  Medication Sig Start Date End Date Taking? Authorizing Provider  ascorbic acid (VITAMIN C) 1000 MG tablet Take 1,000 mg by mouth daily.   Yes [provider]  Cholecalciferol (D3  2000) 50 MCG (2000 UT) CAPS Take 1 capsule by mouth daily.   Yes [provider]  cycloSPORINE (RESTASIS) 0.05 % ophthalmic emulsion Apply 1 drop to eye. 06/30/24  Yes [provider]  Influenza vac split trivalent PF (FLUZONE  HIGH-DOSE) 0.5 ML injection Inject 0.5 mLs into the muscle once for 1 dose. 09/27/24 09/28/24 Yes Luiz Channel, MD  PREVIDENT 5000 BOOSTER PLUS 1.1 % PSTE Take 1 Application by mouth 2 (two) times daily. On teeth 06/07/24  Yes [provider]  PREVIDENT 5000 SENSITIVE 1.1-5 % GEL Take 1 Application by mouth in the morning and at bedtime. On teeth 06/07/24  Yes [provider]  Tdap (ADACEL ) 12-23-13.5 LF-MCG/0.5 SUSY injection Inject 0.5 mLs into the muscle once for 1 dose. 09/27/24 09/28/24 Yes Luiz Channel, MD  UNABLE TO FIND Take 3 tablets by mouth daily. Med Name: Bone Strength   Yes [provider]  zinc gluconate 50 MG tablet Take 50 mg by mouth daily.   Yes [provider]    Blood pressure 122/73, pulse 80, height 5' 1 (1.549 m), weight 137 lb (62.1 kg), SpO2 95%. Exam: General: Communicates without difficulty, well nourished, no acute distress. Head: Normocephalic, no evidence injury, no tenderness, facial buttresses intact without stepoff. Face/sinus: No tenderness to palpation and percussion. Facial movement is normal and symmetric. Eyes: PERRL, EOMI. No scleral icterus, conjunctivae clear. Neuro: CN II exam reveals vision grossly intact.  No nystagmus at any point of gaze. Ears: Auricles well formed without lesions.  Ear canals are intact without mass or lesion.  No erythema or edema is appreciated.  The TMs are intact without fluid. Nose: External evaluation reveals normal support and skin without lesions.  Dorsum is intact.  Anterior rhinoscopy reveals congested mucosa over anterior aspect of inferior turbinates and intact septum.  No purulence noted. Oral:  Oral cavity and oropharynx are intact, symmetric, without erythema or edema.  Mucosa is moist without lesions. Neck: Full range of motion without pain.  There is no significant lymphadenopathy.  No masses palpable.  Thyroid  bed within normal limits to palpation.  Parotid glands and submandibular glands equal bilaterally without mass.  Trachea is midline. Neuro:  CN 2-12 grossly intact. Vestibular: No nystagmus at any point of gaze. Dix Hallpike negative. Vestibular: There is no nystagmus with pneumatic pressure on either tympanic membrane  or Valsalva. The cerebellar examination is unremarkable.   Assessment & Plan Right acoustic neuroma with progressive hearing loss and dizziness She has a longstanding right IAC acoustic neuroma, stable in size on serial MRI, with progressive right-sided hearing loss and balance difficulty, compounded by severe left eye vision loss, increasing fall risk. Current management is conservative observation due to tumor stability and age.  - The possible treatment options are extensively discussed.  The options include conservative observation, surgical resection, or gamma knife radiation.  The pros, cons, and details of each treatment modality are reviewed.  - Ordered repeat MRI brain with internal auditory canal protocol for July 2026 to monitor for interval growth. - Placed referral to physical therapy for vestibular/balance rehabilitation with a therapist experienced in balance disorders. - Advised continuation of cross hearing aid and follow-up with audiology for any issues with hearing aid function or sound quality. - Provided education regarding safety and fall prevention, including use of assistive devices and caution on uneven surfaces. - Discussed that if MRI demonstrates significant growth or new symptoms develop, further management options including radiation or surgery may be considered; current approach  remains observation. - Arranged follow-up with MRI results; if stable, annual follow-up is recommended, with earlier follow-up if significant change is detected.  Robby Bulkley W Tessia Kassin 09/27/2024, 3:59 PM      [1] No Known Allergies

## 2024-09-28 NOTE — Telephone Encounter (Signed)
 Called pt and no answer left message to return call.

## 2024-09-29 NOTE — Telephone Encounter (Signed)
 Patient was advised and stated she received flu and TDAP vaccine. She changed all appointments to Dr. Dameron.

## 2024-10-25 ENCOUNTER — Other Ambulatory Visit (HOSPITAL_BASED_OUTPATIENT_CLINIC_OR_DEPARTMENT_OTHER)

## 2024-10-25 ENCOUNTER — Ambulatory Visit (HOSPITAL_BASED_OUTPATIENT_CLINIC_OR_DEPARTMENT_OTHER)

## 2025-02-19 ENCOUNTER — Ambulatory Visit: Admitting: Family Medicine

## 2025-02-19 ENCOUNTER — Encounter: Admitting: Student

## 2025-03-21 ENCOUNTER — Ambulatory Visit: Admitting: Hematology & Oncology

## 2025-03-21 ENCOUNTER — Inpatient Hospital Stay

## 2025-06-15 ENCOUNTER — Ambulatory Visit

## 2025-08-23 ENCOUNTER — Encounter: Admitting: Family Medicine

## 2025-08-23 ENCOUNTER — Encounter: Admitting: Student
